# Patient Record
Sex: Female | Born: 1935 | Race: Black or African American | Hispanic: No | Marital: Married | Smoking: Former smoker
Health system: Southern US, Community
[De-identification: ages and names within clinical notes are randomized; demographics above are authoritative.]

## PROBLEM LIST (undated history)

## (undated) DIAGNOSIS — E039 Hypothyroidism, unspecified: Secondary | ICD-10-CM

## (undated) DIAGNOSIS — Z86010 Personal history of colonic polyps: Secondary | ICD-10-CM

## (undated) DIAGNOSIS — K449 Diaphragmatic hernia without obstruction or gangrene: Secondary | ICD-10-CM

## (undated) DIAGNOSIS — R51 Headache: Secondary | ICD-10-CM

## (undated) DIAGNOSIS — K219 Gastro-esophageal reflux disease without esophagitis: Secondary | ICD-10-CM

## (undated) DIAGNOSIS — Z800 Family history of malignant neoplasm of digestive organs: Secondary | ICD-10-CM

## (undated) DIAGNOSIS — C801 Malignant (primary) neoplasm, unspecified: Secondary | ICD-10-CM

## (undated) DIAGNOSIS — K5010 Crohn's disease of large intestine without complications: Secondary | ICD-10-CM

## (undated) DIAGNOSIS — J189 Pneumonia, unspecified organism: Secondary | ICD-10-CM

## (undated) DIAGNOSIS — K859 Acute pancreatitis without necrosis or infection, unspecified: Secondary | ICD-10-CM

## (undated) DIAGNOSIS — D125 Benign neoplasm of sigmoid colon: Secondary | ICD-10-CM

## (undated) DIAGNOSIS — K5090 Crohn's disease, unspecified, without complications: Secondary | ICD-10-CM

## (undated) DIAGNOSIS — K5730 Diverticulosis of large intestine without perforation or abscess without bleeding: Secondary | ICD-10-CM

## (undated) DIAGNOSIS — E785 Hyperlipidemia, unspecified: Secondary | ICD-10-CM

## (undated) DIAGNOSIS — K635 Polyp of colon: Secondary | ICD-10-CM

## (undated) DIAGNOSIS — K5909 Other constipation: Secondary | ICD-10-CM

## (undated) DIAGNOSIS — R32 Unspecified urinary incontinence: Secondary | ICD-10-CM

## (undated) DIAGNOSIS — I10 Essential (primary) hypertension: Secondary | ICD-10-CM

## (undated) DIAGNOSIS — K2270 Barrett's esophagus without dysplasia: Secondary | ICD-10-CM

## (undated) DIAGNOSIS — R519 Headache, unspecified: Secondary | ICD-10-CM

## (undated) DIAGNOSIS — M069 Rheumatoid arthritis, unspecified: Secondary | ICD-10-CM

## (undated) HISTORY — DX: Benign neoplasm of sigmoid colon: D12.5

## (undated) HISTORY — DX: Diaphragmatic hernia without obstruction or gangrene: K44.9

## (undated) HISTORY — DX: Family history of malignant neoplasm of digestive organs: Z80.0

## (undated) HISTORY — DX: Diverticulosis of large intestine without perforation or abscess without bleeding: K57.30

## (undated) HISTORY — DX: Barrett's esophagus without dysplasia: K22.70

## (undated) HISTORY — DX: Hypothyroidism, unspecified: E03.9

## (undated) HISTORY — DX: Crohn's disease, unspecified, without complications: K50.90

## (undated) HISTORY — DX: Personal history of colonic polyps: Z86.010

## (undated) HISTORY — DX: Hyperlipidemia, unspecified: E78.5

## (undated) HISTORY — DX: Polyp of colon: K63.5

## (undated) HISTORY — DX: Rheumatoid arthritis, unspecified: M06.9

## (undated) HISTORY — DX: Gastro-esophageal reflux disease without esophagitis: K21.9

## (undated) HISTORY — DX: Other constipation: K59.09

## (undated) HISTORY — DX: Unspecified urinary incontinence: R32

## (undated) HISTORY — DX: Crohn's disease of large intestine without complications: K50.10

## (undated) HISTORY — PX: KNEE ARTHROSCOPY: SUR90

## (undated) HISTORY — DX: Acute pancreatitis without necrosis or infection, unspecified: K85.90

---

## 1999-12-18 ENCOUNTER — Other Ambulatory Visit: Admission: RE | Admit: 1999-12-18 | Discharge: 1999-12-18 | Payer: Self-pay | Admitting: Gastroenterology

## 2001-07-08 ENCOUNTER — Ambulatory Visit (HOSPITAL_COMMUNITY): Admission: RE | Admit: 2001-07-08 | Discharge: 2001-07-08 | Payer: Self-pay | Admitting: Gastroenterology

## 2001-07-08 ENCOUNTER — Encounter: Payer: Self-pay | Admitting: Gastroenterology

## 2001-07-14 ENCOUNTER — Other Ambulatory Visit: Admission: RE | Admit: 2001-07-14 | Discharge: 2001-07-14 | Payer: Self-pay | Admitting: Obstetrics and Gynecology

## 2002-08-04 ENCOUNTER — Encounter: Admission: RE | Admit: 2002-08-04 | Discharge: 2002-08-04 | Payer: Self-pay | Admitting: Cardiology

## 2002-08-04 ENCOUNTER — Encounter: Payer: Self-pay | Admitting: Cardiology

## 2003-12-14 ENCOUNTER — Ambulatory Visit (HOSPITAL_COMMUNITY): Admission: RE | Admit: 2003-12-14 | Discharge: 2003-12-14 | Payer: Self-pay | Admitting: Unknown Physician Specialty

## 2005-03-21 ENCOUNTER — Ambulatory Visit: Payer: Self-pay | Admitting: Gastroenterology

## 2005-06-03 ENCOUNTER — Ambulatory Visit: Payer: Self-pay | Admitting: Gastroenterology

## 2006-04-08 ENCOUNTER — Ambulatory Visit: Payer: Self-pay | Admitting: Gastroenterology

## 2006-07-18 ENCOUNTER — Ambulatory Visit: Payer: Self-pay | Admitting: Gastroenterology

## 2007-02-04 ENCOUNTER — Ambulatory Visit: Payer: Self-pay | Admitting: Gastroenterology

## 2007-02-25 ENCOUNTER — Encounter: Payer: Self-pay | Admitting: Gastroenterology

## 2007-02-25 ENCOUNTER — Ambulatory Visit: Payer: Self-pay | Admitting: Gastroenterology

## 2007-07-07 ENCOUNTER — Ambulatory Visit: Payer: Self-pay | Admitting: Gastroenterology

## 2007-10-01 ENCOUNTER — Ambulatory Visit: Payer: Self-pay | Admitting: Internal Medicine

## 2007-10-01 LAB — CONVERTED CEMR LAB
Basophils Absolute: 0 10*3/uL (ref 0.0–0.1)
Basophils Relative: 0.2 % (ref 0.0–1.0)
Eosinophils Absolute: 0.3 10*3/uL (ref 0.0–0.6)
Eosinophils Relative: 5 % (ref 0.0–5.0)
HCT: 40.9 % (ref 36.0–46.0)
Hemoglobin: 13.2 g/dL (ref 12.0–15.0)
Lymphocytes Relative: 24.4 % (ref 12.0–46.0)
MCHC: 32.3 g/dL (ref 30.0–36.0)
MCV: 90.1 fL (ref 78.0–100.0)
Monocytes Absolute: 0.3 10*3/uL (ref 0.2–0.7)
Monocytes Relative: 4.5 % (ref 3.0–11.0)
Neutro Abs: 4.5 10*3/uL (ref 1.4–7.7)
Neutrophils Relative %: 65.9 % (ref 43.0–77.0)
Platelets: 289 10*3/uL (ref 150–400)
RBC: 4.53 M/uL (ref 3.87–5.11)
RDW: 13.3 % (ref 11.5–14.6)
WBC: 6.8 10*3/uL (ref 4.5–10.5)

## 2007-10-02 ENCOUNTER — Ambulatory Visit: Payer: Self-pay | Admitting: Gastroenterology

## 2007-10-02 LAB — CONVERTED CEMR LAB
Basophils Absolute: 0 10*3/uL (ref 0.0–0.1)
Basophils Relative: 0.5 % (ref 0.0–1.0)
CRP, High Sensitivity: 2 (ref 0.00–5.00)
Eosinophils Absolute: 0.3 10*3/uL (ref 0.0–0.6)
Eosinophils Relative: 5.6 % — ABNORMAL HIGH (ref 0.0–5.0)
Ferritin: 204.4 ng/mL (ref 10.0–291.0)
Folate: 8 ng/mL
HCT: 42.3 % (ref 36.0–46.0)
Hemoglobin: 13.3 g/dL (ref 12.0–15.0)
Iron: 109 ug/dL (ref 42–145)
Lymphocytes Relative: 23.4 % (ref 12.0–46.0)
MCHC: 31.5 g/dL (ref 30.0–36.0)
MCV: 90.3 fL (ref 78.0–100.0)
Monocytes Absolute: 0.3 10*3/uL (ref 0.2–0.7)
Monocytes Relative: 6.3 % (ref 3.0–11.0)
Neutro Abs: 2.9 10*3/uL (ref 1.4–7.7)
Neutrophils Relative %: 64.2 % (ref 43.0–77.0)
Platelets: 294 10*3/uL (ref 150–400)
RBC: 4.68 M/uL (ref 3.87–5.11)
RDW: 13.4 % (ref 11.5–14.6)
Saturation Ratios: 31.5 % (ref 20.0–50.0)
Sed Rate: 30 mm/hr — ABNORMAL HIGH (ref 0–25)
Transferrin: 246.8 mg/dL (ref >=212.0)
Vitamin B-12: 383 pg/mL (ref 211–911)
WBC: 4.6 10*3/uL (ref 4.5–10.5)

## 2007-10-07 ENCOUNTER — Encounter: Payer: Self-pay | Admitting: Internal Medicine

## 2007-10-07 ENCOUNTER — Ambulatory Visit: Payer: Self-pay | Admitting: Internal Medicine

## 2007-10-22 ENCOUNTER — Ambulatory Visit: Payer: Self-pay | Admitting: Gastroenterology

## 2008-04-22 ENCOUNTER — Telehealth: Payer: Self-pay | Admitting: Gastroenterology

## 2008-04-29 DIAGNOSIS — E785 Hyperlipidemia, unspecified: Secondary | ICD-10-CM

## 2008-04-29 DIAGNOSIS — K219 Gastro-esophageal reflux disease without esophagitis: Secondary | ICD-10-CM | POA: Insufficient documentation

## 2008-04-29 DIAGNOSIS — K2270 Barrett's esophagus without dysplasia: Secondary | ICD-10-CM | POA: Insufficient documentation

## 2008-04-29 DIAGNOSIS — K5730 Diverticulosis of large intestine without perforation or abscess without bleeding: Secondary | ICD-10-CM | POA: Insufficient documentation

## 2008-04-29 DIAGNOSIS — D126 Benign neoplasm of colon, unspecified: Secondary | ICD-10-CM | POA: Insufficient documentation

## 2008-04-29 DIAGNOSIS — E039 Hypothyroidism, unspecified: Secondary | ICD-10-CM | POA: Insufficient documentation

## 2008-04-29 DIAGNOSIS — K449 Diaphragmatic hernia without obstruction or gangrene: Secondary | ICD-10-CM | POA: Insufficient documentation

## 2008-04-29 DIAGNOSIS — M069 Rheumatoid arthritis, unspecified: Secondary | ICD-10-CM | POA: Insufficient documentation

## 2008-04-29 DIAGNOSIS — K5909 Other constipation: Secondary | ICD-10-CM

## 2008-05-03 ENCOUNTER — Ambulatory Visit: Payer: Self-pay | Admitting: Gastroenterology

## 2008-05-03 LAB — CONVERTED CEMR LAB
Basophils Absolute: 0 10*3/uL (ref 0.0–0.1)
Basophils Relative: 0.1 % (ref 0.0–3.0)
Eosinophils Absolute: 0.4 10*3/uL (ref 0.0–0.7)
Eosinophils Relative: 5.6 % — ABNORMAL HIGH (ref 0.0–5.0)
HCT: 40 % (ref 36.0–46.0)
Hemoglobin: 13.5 g/dL (ref 12.0–15.0)
Lymphocytes Relative: 20 % (ref 12.0–46.0)
MCHC: 33.8 g/dL (ref 30.0–36.0)
MCV: 91.5 fL (ref 78.0–100.0)
Monocytes Absolute: 0.3 10*3/uL (ref 0.1–1.0)
Monocytes Relative: 4.4 % (ref 3.0–12.0)
Neutro Abs: 4.7 10*3/uL (ref 1.4–7.7)
Neutrophils Relative %: 69.9 % (ref 43.0–77.0)
Platelets: 290 10*3/uL (ref 150–400)
RBC: 4.37 M/uL (ref 3.87–5.11)
RDW: 12.8 % (ref 11.5–14.6)
Sed Rate: 30 mm/hr — ABNORMAL HIGH (ref 0–22)
WBC: 6.7 10*3/uL (ref 4.5–10.5)

## 2009-02-20 ENCOUNTER — Telehealth: Payer: Self-pay | Admitting: Gastroenterology

## 2009-02-27 ENCOUNTER — Ambulatory Visit: Payer: Self-pay | Admitting: Gastroenterology

## 2009-02-27 DIAGNOSIS — K5289 Other specified noninfective gastroenteritis and colitis: Secondary | ICD-10-CM | POA: Insufficient documentation

## 2009-02-27 DIAGNOSIS — R32 Unspecified urinary incontinence: Secondary | ICD-10-CM

## 2009-02-27 DIAGNOSIS — K625 Hemorrhage of anus and rectum: Secondary | ICD-10-CM | POA: Insufficient documentation

## 2009-03-01 LAB — CONVERTED CEMR LAB
ALT: 20 units/L (ref 0–35)
AST: 19 units/L (ref 0–37)
Albumin: 4.2 g/dL (ref 3.5–5.2)
Alkaline Phosphatase: 68 units/L (ref 39–117)
BUN: 18 mg/dL (ref 6–23)
Basophils Absolute: 0 10*3/uL (ref 0.0–0.1)
Basophils Relative: 0.4 % (ref 0.0–3.0)
Bilirubin Urine: NEGATIVE
Bilirubin, Direct: 0.1 mg/dL (ref 0.0–0.3)
CO2: 31 meq/L (ref 19–32)
Calcium: 9.1 mg/dL (ref 8.4–10.5)
Chloride: 105 meq/L (ref 96–112)
Creatinine, Ser: 1.2 mg/dL (ref 0.4–1.2)
Eosinophils Absolute: 0.4 10*3/uL (ref 0.0–0.7)
Eosinophils Relative: 6.4 % — ABNORMAL HIGH (ref 0.0–5.0)
Ferritin: 157 ng/mL (ref 10.0–291.0)
Folate: 8.3 ng/mL
GFR calc non Af Amer: 56.3 mL/min (ref >60)
Glucose, Bld: 91 mg/dL (ref 70–99)
HCT: 39.7 % (ref 36.0–46.0)
Hemoglobin: 13.4 g/dL (ref 12.0–15.0)
Iron: 69 ug/dL (ref 42–145)
Ketones, ur: NEGATIVE mg/dL
Leukocytes, UA: NEGATIVE
Lymphocytes Relative: 22.8 % (ref 12.0–46.0)
Lymphs Abs: 1.5 10*3/uL (ref 0.7–4.0)
MCHC: 33.8 g/dL (ref 30.0–36.0)
MCV: 90.4 fL (ref 78.0–100.0)
Monocytes Absolute: 0.4 10*3/uL (ref 0.1–1.0)
Monocytes Relative: 5.6 % (ref 3.0–12.0)
Neutro Abs: 4.1 10*3/uL (ref 1.4–7.7)
Neutrophils Relative %: 64.8 % (ref 43.0–77.0)
Nitrite: NEGATIVE
Platelets: 248 10*3/uL (ref 150.0–400.0)
Potassium: 4.4 meq/L (ref 3.5–5.1)
RBC: 4.4 M/uL (ref 3.87–5.11)
RDW: 12.7 % (ref 11.5–14.6)
Saturation Ratios: 20.8 % (ref 20.0–50.0)
Sodium: 142 meq/L (ref 135–145)
Specific Gravity, Urine: 1.025 (ref 1.000–1.030)
TSH: 3.77 microintl units/mL (ref 0.35–5.50)
Total Bilirubin: 0.7 mg/dL (ref 0.3–1.2)
Total Protein, Urine: NEGATIVE mg/dL
Total Protein: 8.1 g/dL (ref 6.0–8.3)
Transferrin: 236.7 mg/dL (ref 212.0–360.0)
Urine Glucose: NEGATIVE mg/dL
Urobilinogen, UA: 0.2 (ref 0.0–1.0)
Vitamin B-12: 463 pg/mL (ref 211–911)
WBC: 6.4 10*3/uL (ref 4.5–10.5)
pH: 5.5 (ref 5.0–8.0)

## 2009-03-16 ENCOUNTER — Encounter: Payer: Self-pay | Admitting: Gastroenterology

## 2009-03-22 ENCOUNTER — Encounter: Payer: Self-pay | Admitting: Gastroenterology

## 2009-03-22 ENCOUNTER — Ambulatory Visit: Payer: Self-pay | Admitting: Gastroenterology

## 2009-03-27 ENCOUNTER — Encounter: Payer: Self-pay | Admitting: Gastroenterology

## 2009-11-09 ENCOUNTER — Telehealth: Payer: Self-pay | Admitting: Gastroenterology

## 2009-11-13 ENCOUNTER — Telehealth: Payer: Self-pay | Admitting: Gastroenterology

## 2010-01-19 ENCOUNTER — Encounter: Payer: Self-pay | Admitting: Gastroenterology

## 2010-01-25 ENCOUNTER — Ambulatory Visit: Payer: Self-pay | Admitting: Gastroenterology

## 2010-01-25 DIAGNOSIS — K5010 Crohn's disease of large intestine without complications: Secondary | ICD-10-CM | POA: Insufficient documentation

## 2010-01-25 LAB — CONVERTED CEMR LAB
ALT: 20 units/L (ref 0–35)
AST: 20 units/L (ref 0–37)
Albumin: 4.5 g/dL (ref 3.5–5.2)
Alkaline Phosphatase: 69 units/L (ref 39–117)
BUN: 16 mg/dL (ref 6–23)
Basophils Absolute: 0 10*3/uL (ref 0.0–0.1)
Basophils Relative: 0.4 % (ref 0.0–3.0)
Bilirubin, Direct: 0.1 mg/dL (ref 0.0–0.3)
CO2: 32 meq/L (ref 19–32)
Calcium: 9.5 mg/dL (ref 8.4–10.5)
Chloride: 101 meq/L (ref 96–112)
Creatinine, Ser: 1.1 mg/dL (ref 0.4–1.2)
Eosinophils Absolute: 0.4 10*3/uL (ref 0.0–0.7)
Eosinophils Relative: 5.6 % — ABNORMAL HIGH (ref 0.0–5.0)
Ferritin: 180.3 ng/mL (ref 10.0–291.0)
Folate: 9.8 ng/mL
GFR calc non Af Amer: 62.09 mL/min (ref >60)
Glucose, Bld: 94 mg/dL (ref 70–99)
HCT: 41 % (ref 36.0–46.0)
Hemoglobin: 13.9 g/dL (ref 12.0–15.0)
Iron: 87 ug/dL (ref 42–145)
Lymphocytes Relative: 24.5 % (ref 12.0–46.0)
Lymphs Abs: 1.6 10*3/uL (ref 0.7–4.0)
MCHC: 33.9 g/dL (ref 30.0–36.0)
MCV: 91.1 fL (ref 78.0–100.0)
Magnesium: 2.8 mg/dL — ABNORMAL HIGH (ref 1.5–2.5)
Monocytes Absolute: 0.4 10*3/uL (ref 0.1–1.0)
Monocytes Relative: 5.4 % (ref 3.0–12.0)
Neutro Abs: 4.1 10*3/uL (ref 1.4–7.7)
Neutrophils Relative %: 64.1 % (ref 43.0–77.0)
Platelets: 284 10*3/uL (ref 150.0–400.0)
Potassium: 4.9 meq/L (ref 3.5–5.1)
RBC: 4.51 M/uL (ref 3.87–5.11)
RDW: 13.5 % (ref 11.5–14.6)
Saturation Ratios: 26 % (ref 20.0–50.0)
Sed Rate: 24 mm/hr — ABNORMAL HIGH (ref 0–22)
Sodium: 136 meq/L (ref 135–145)
TSH: 3.5 microintl units/mL (ref 0.35–5.50)
Total Bilirubin: 0.5 mg/dL (ref 0.3–1.2)
Total Protein: 8.3 g/dL (ref 6.0–8.3)
Transferrin: 239.2 mg/dL (ref 212.0–360.0)
Vitamin B-12: 464 pg/mL (ref 211–911)
WBC: 6.5 10*3/uL (ref 4.5–10.5)

## 2010-02-06 ENCOUNTER — Telehealth: Payer: Self-pay | Admitting: Gastroenterology

## 2010-03-16 ENCOUNTER — Telehealth: Payer: Self-pay | Admitting: Gastroenterology

## 2010-03-19 ENCOUNTER — Telehealth: Payer: Self-pay | Admitting: Gastroenterology

## 2010-08-30 ENCOUNTER — Ambulatory Visit (INDEPENDENT_AMBULATORY_CARE_PROVIDER_SITE_OTHER): Payer: Medicare Other | Admitting: Gastroenterology

## 2010-08-30 ENCOUNTER — Other Ambulatory Visit: Payer: Self-pay | Admitting: Gastroenterology

## 2010-08-30 ENCOUNTER — Encounter (INDEPENDENT_AMBULATORY_CARE_PROVIDER_SITE_OTHER): Payer: Self-pay | Admitting: *Deleted

## 2010-08-30 ENCOUNTER — Encounter: Payer: Self-pay | Admitting: Gastroenterology

## 2010-08-30 ENCOUNTER — Other Ambulatory Visit: Payer: Medicare Other

## 2010-08-30 DIAGNOSIS — K219 Gastro-esophageal reflux disease without esophagitis: Secondary | ICD-10-CM

## 2010-08-30 DIAGNOSIS — K625 Hemorrhage of anus and rectum: Secondary | ICD-10-CM

## 2010-08-30 LAB — CBC WITH DIFFERENTIAL/PLATELET
Basophils Absolute: 0 10*3/uL (ref 0.0–0.1)
Basophils Relative: 0.5 % (ref 0.0–3.0)
Eosinophils Absolute: 0.4 10*3/uL (ref 0.0–0.7)
Eosinophils Relative: 6.3 % — ABNORMAL HIGH (ref 0.0–5.0)
HCT: 38.2 % (ref 36.0–46.0)
Hemoglobin: 12.9 g/dL (ref 12.0–15.0)
Lymphocytes Relative: 24.4 % (ref 12.0–46.0)
Lymphs Abs: 1.4 10*3/uL (ref 0.7–4.0)
MCHC: 33.8 g/dL (ref 30.0–36.0)
MCV: 90.9 fl (ref 78.0–100.0)
Monocytes Absolute: 0.5 10*3/uL (ref 0.1–1.0)
Monocytes Relative: 8.4 % (ref 3.0–12.0)
Neutro Abs: 3.5 10*3/uL (ref 1.4–7.7)
Neutrophils Relative %: 60.4 % (ref 43.0–77.0)
Platelets: 286 10*3/uL (ref 150.0–400.0)
RBC: 4.21 Mil/uL (ref 3.87–5.11)
RDW: 13.3 % (ref 11.5–14.6)
WBC: 5.8 10*3/uL (ref 4.5–10.5)

## 2010-08-30 LAB — BASIC METABOLIC PANEL
BUN: 15 mg/dL (ref 6–23)
CO2: 28 mEq/L (ref 19–32)
Calcium: 9.1 mg/dL (ref 8.4–10.5)
Creatinine, Ser: 1 mg/dL (ref 0.4–1.2)
GFR: 67.64 mL/min (ref >60.00)
Glucose, Bld: 87 mg/dL (ref 70–99)
Sodium: 134 mEq/L — ABNORMAL LOW (ref 135–145)

## 2010-08-30 LAB — VITAMIN B12: Vitamin B-12: 494 pg/mL (ref 211–911)

## 2010-08-30 LAB — FOLATE: Folate: 11.9 ng/mL (ref >5.9)

## 2010-08-30 LAB — IBC PANEL: Saturation Ratios: 23 % (ref 20.0–50.0)

## 2010-08-30 LAB — TSH: TSH: 1.18 u[IU]/mL (ref 0.35–5.50)

## 2010-08-30 LAB — HEPATIC FUNCTION PANEL
Albumin: 4 g/dL (ref 3.5–5.2)
Alkaline Phosphatase: 60 U/L (ref 39–117)
Total Protein: 7.4 g/dL (ref 6.0–8.3)

## 2010-08-30 LAB — FERRITIN: Ferritin: 210.1 ng/mL (ref 10.0–291.0)

## 2010-08-30 NOTE — Progress Notes (Signed)
Summary: med ?  Phone Note Call from Patient Call back at Home Phone [phone]   Caller: Patient Call For: Dr. Jarold Motto Reason for Call: Talk to Nurse Summary of Call: med, Denese Killings, is unaffordable for pt Initial call taken by: Vallarie Mare,  November 13, 2009 4:13 PM  Follow-up for Phone Call        See phone note from 11/09/09. Follow-up by: Ashok Cordia RN,  November 13, 2009 4:40 PM  Additional Follow-up for Phone Call Additional follow up Details #1::        annusol hc is ok Additional Follow-up by: Mardella Layman MD [facility],  November 14, 2009 11:29 AM     Appended Document: med ? Talked with pt.  States she if doing better so she will wait a few days before deciding about the suppositories.  She will report back.

## 2010-08-30 NOTE — Progress Notes (Signed)
Summary: Triage  Phone Note Call from Patient   Caller: Patient Summary of Call: Pt calling.  She con't to have some bright red rectal bleeding.  She did not try the canasa suppos that were suggested at last OV due to the cost.  Pt asks if she can try the anusol HC suppos again.  This is alot less expensive.  Pt also asks if she should cont's the lialda.  She feels thei does not help as much as the asacol did.   Initial call taken by: Ashok Cordia RN,  March 16, 2010 2:07 PM  Follow-up for Phone Call        ok for changes Follow-up by: Mardella Layman MD FACG,  March 16, 2010 2:33 PM  Additional Follow-up for Phone Call Additional follow up Details #1::        Pt notified.  Rx sent for supposiories. Pt req samples of asacol.  States this is expensive also. Wil check on samples. Additional Follow-up by: Ashok Cordia RN,  March 16, 2010 2:44 PM    New/Updated Medications: ANUSOL-HC 25 MG  SUPP (HYDROCORTISONE ACETATE) 1 per rectum q hs Prescriptions: ANUSOL-HC 25 MG  SUPP (HYDROCORTISONE ACETATE) 1 per rectum q hs  #30 x 3   Entered by:   Ashok Cordia RN   Authorized by:   Mardella Layman MD [facility]   Signed by:   Ashok Cordia RN on 03/16/2010   Method used:   [facility]  [address] [phone]* (retail)       [address]       [place], [place]  [address]       Ph: [phone] or [phone]       Fax: [phone]   RxID:   [id number]

## 2010-08-30 NOTE — Progress Notes (Signed)
Summary: Lab results  Phone Note Call from Patient Call back at [phone] Cell#   Caller: Patient Call For: Dr. Jarold Motto Reason for Call: Lab or Test Results Summary of Call: Would like Lab results Initial call taken by: Karna Christmas,  February 06, 2010 10:52 AM  Follow-up for Phone Call        Discussed lab results with pt.  Pt asks that we send copy to Dr. Althea Grimmer.  This was done as requested. Follow-up by: Ashok Cordia RN,  February 06, 2010 11:09 AM

## 2010-08-30 NOTE — Assessment & Plan Note (Signed)
Summary: annual f/u...as.   History of Present Illness Visit Type: Follow-up Visit Primary GI MD: Sheryn Bison MD FACP FAGA Primary Provider: Truddie Crumble, MD Requesting Provider: n/a Chief Complaint: F/u for Crohn's. Pt states that she is having alot of gas  History of Present Illness:   She is a 77 year old African American female who has chronic segmental colitis involving her sigmoid colon with associated severe diverticulosis. Colonoscopy was last performed in August of 2010. She supposedly is on oral Asacol 800 mg 3 times a day which she takes very irregularly. She does use Cana1 g suppositories p.r.n. Currently she denies abdominal pain or rectal bleeding her appetite is good her weight is stable. She has excessive gas related to ingestion of sorbitol. Associated with her IBD is low back pain consistent with sacroiliitis , and recurrent oral stomatitis.   GI Review of Systems      Denies abdominal pain, acid reflux, belching, bloating, chest pain, dysphagia with liquids, dysphagia with solids, heartburn, loss of appetite, nausea, vomiting, vomiting blood, weight loss, and  weight gain.        Denies anal fissure, black tarry stools, change in bowel habit, constipation, diarrhea, diverticulosis, fecal incontinence, heme positive stool, hemorrhoids, irritable bowel syndrome, jaundice, light color stool, liver problems, rectal bleeding, and  rectal pain.    Current Medications (verified): 1)  Asacol Hd 800 Mg Tbec (Mesalamine) .... Take One By Mouth Three Times A Day 2)  Protonix 40 Mg Tbec (Pantoprazole Sodium) .... Take 1 Tablet By Mouth Once A Day 3)  Synthroid 125 Mcg Tabs (Levothyroxine Sodium) .... Take One By Mouth Once Daily 4)  Diovan 160 Mg Tabs (Valsartan) .... Take One By Mouth Once Daily 5)  Vitamin D 1000 Unit Tabs (Cholecalciferol) .... One Tablet By Mouth Two Times A Day 6)  Levsin 0.125 Mg Tabs (Hyoscyamine Sulfate) .... Take One By Mouth As Needed 7)  Canasa  1000 Mg  Supp (Mesalamine) .Marland Kitchen.. 1 Q Hs  Allergies (verified): No Known Drug Allergies  Past History:  Past medical, surgical, family and social histories (including risk factors) reviewed for relevance to current acute and chronic problems.  Past Medical History: Reviewed history from 04/29/2008 and no changes required. Current Problems:  HYPOTHYROIDISM (ICD-244.9) ARTHRITIS, RHEUMATOID (ICD-714.0) HYPERLIPIDEMIA (ICD-272.4) CONSTIPATION, CHRONIC (ICD-564.09) BARRETTS ESOPHAGUS (ICD-530.85) GERD (ICD-530.81) HIATAL HERNIA (ICD-553.3) CROHN'S DISEASE (ICD-555.9) COLONIC POLYPS, ADENOMATOUS, HX OF (ICD-V12.72) SIGMOID POLYP (ICD-211.3) DIVERTICULOSIS, COLON (ICD-562.10)  Past Surgical History: Reviewed history from 04/29/2008 and no changes required. right knee arthroscopy  Family History: Reviewed history from 05/03/2008 and no changes required. Family History of Colon Cancer:mother Family History of Esophageal Cancer:  Brother Family History of Heart Disease: Father, Mother  Social History: Reviewed history from 05/03/2008 and no changes required. Patient currently smokes. 6 cigarettes daily married Alcohol Use - no Daily Caffeine Use Illicit Drug Use - no Patient gets regular exercise.  Vital Signs:  Patient profile:   77 year old female Height:      65 inches Weight:      177 pounds BMI:     29.56 BSA:     1.88 Pulse rate:   74 / minute Pulse rhythm:   regular BP sitting:   134 / 82  (left arm) Cuff size:   regular  Vitals Entered By: Ok Anis CMA (January 25, 2010 10:42 AM)  Review of Systems       The patient complains of arthritis/joint pain and back pain.  The patient denies allergy/sinus, anemia, anxiety-new,  blood in urine, breast changes/lumps, change in vision, confusion, cough, coughing up blood, depression-new, fainting, fatigue, fever, headaches-new, hearing problems, heart murmur, heart rhythm changes, itching, menstrual pain, muscle  pains/cramps, night sweats, nosebleeds, pregnancy symptoms, shortness of breath, skin rash, sleeping problems, sore throat, swelling of feet/legs, swollen lymph glands, thirst - excessive , urination - excessive , urination changes/pain, urine leakage, vision changes, and voice change.    CC: F/u for Crohn's. Pt states that she is having alot of gas    Physical Exam  General:  Well developed, well nourished, no acute distress.healthy appearing.   Head:  Normocephalic and atraumatic. Eyes:  PERRLA, no icterus.exam deferred to patient's ophthalmologist.   Lungs:  Clear throughout to auscultation. Heart:  Regular rate and rhythm; no murmurs, rubs,  or bruits. Abdomen:  Soft, nontender and nondistended. No masses, hepatosplenomegaly or hernias noted. Normal bowel sounds. Psych:  Alert and cooperative. Normal mood and affect.   Impression & Recommendations:  Problem # 1:  CROHN'S DISEASE-LARGE INTESTINE (ICD-555.1) Assessment Improved She has chronic segmental colitis with periodic flares. She is unreliable in terms of taking her medications. I have changed her to Lialda 2.4 g a day with p.r.n. Canasa 1 g suppository use. Repeat labs have been ordered today. She has been asked to avoid sorbitol and fructose in her diet.  Problem # 2:  RECTAL BLEEDING (ICD-569.3) Assessment: Improved  Problem # 3:  ARTHRITIS, RHEUMATOID (ICD-714.0) Assessment: Unchanged This diagnosis is unclear---I suspect her arthritis is associated with her inflammatory bowel disease as is her oral stomatitis. I have recommended avoidance of anti-inflammatories p.r.n. acetaminophen use and p.r.n. Orabase to oral lesions. Sedimentation rate was ordered today.  Problem # 4:  BARRETTS ESOPHAGUS (ICD-530.85) Assessment: Unchanged Her Reflux is well controlled on daily Protonix 40 mg which she is to continue.  Other Orders: TLB-CBC Platelet - w/Differential (85025-CBCD) TLB-BMP (Basic Metabolic Panel-BMET)  (80048-METABOL) TLB-Hepatic/Liver Function Pnl (80076-HEPATIC) TLB-TSH (Thyroid Stimulating Hormone) (84443-TSH) TLB-B12, Serum-Total ONLY (29562-Z30) TLB-Ferritin (82728-FER) TLB-Folic Acid (Folate) (82746-FOL) TLB-IBC Pnl (Iron/FE;Transferrin) (83550-IBC) TLB-Magnesium (Mg) (83735-MG) TLB-Sedimentation Rate (ESR) (85652-ESR)  Patient Instructions: 1)  Please avoid sorbitol and fructose in your diet. 2)  Please go to the basement to have your labs drawn before leaving the office today. We have given you orders to take with you. 3)  Please use [facility] which can be purchased over the counter. 4)  You can use Oravase for your mouth sores. This can be purchased over the counter. 5)  We have given you a gas handout to review. 6)  Please take Lialda 1 tablet by mouth two times a day in place of Asacol. An electronic prescription has been sent to your pharmacy. 7)  Copy sent to : Truddie Crumble, MD 8)  The medication list was reviewed and reconciled.  All changed / newly prescribed medications were explained.  A complete medication list was provided to the patient / caregiver. Prescriptions: LIALDA 1.2 GM TBEC (MESALAMINE) Take 1 tablet by mouth two times a day  #60 x 3   Entered by:   Lamona Curl CMA (AAMA)   Authorized by:   Mardella Layman MD [facility]   Signed by:   Lamona Curl CMA (AAMA) on 01/25/2010   Method used:   [facility]  [address] [phone][facility]       [address]       [place]  [place], [place]  [address]       Ph: [phone] or [phone]       Fax: [phone]   RxID:   [id number]

## 2010-08-30 NOTE — Progress Notes (Signed)
Summary: Triage  Phone Note Outgoing Call   Call placed by: Ashok Cordia RN,  March 19, 2010 11:23 AM Summary of Call: See previous phone note.  Pt wants to go back on asacol.  Pt req samples.  Asacol HD is only available sample.  Pt had been on asacol 400 mg three tabs two times a day.  How should she take the Asacol HD? Initial call taken by: Ashok Cordia RN,  March 19, 2010 11:25 AM  Follow-up for Phone Call        she can afford meds... Follow-up by: Mardella Layman MD [facility],  March 19, 2010 11:28 AM  Additional Follow-up for Phone Call Additional follow up Details #1::        Can take asacol HD 2 tabs two times a day.   Per Dr. Jarold Motto.  Samples given.   Additional Follow-up by: Ashok Cordia RN,  March 19, 2010 12:25 PM    New/Updated Medications: ASACOL HD 800 MG TBEC (MESALAMINE) 2 tabs two times a day/

## 2010-08-30 NOTE — Progress Notes (Signed)
Summary: Blood in stools  Phone Note Call from Patient Call back at Home Phone [phone]   Call For: DR Damean Poffenberger Reason for Call: Talk to Nurse Summary of Call: Blood in her stools. Initial call taken by: Leanor Kail [facility],  November 09, 2009 10:42 AM  Follow-up for Phone Call        Pt has hx of segmental colitis.  On asacol one tab two times a day.  Began 2 weeks ago with small amt of bright red rectal bleeding.  More frequent stools.  States in past has used suppositories which have been helpful.  Req RX.   (pt may be reached at [phone]) Follow-up by: Ashok Cordia RN,  November 09, 2009 1:07 PM  Additional Follow-up for Phone Call Additional follow up Details #1::        needs 2 asacol two times a day and supp... Additional Follow-up by: Mardella Layman MD FACG,  November 10, 2009 8:19 AM    Additional Follow-up for Phone Call Additional follow up Details #2::    2 asacol two times a day and canasa supp at bedtime  Follow-up by: Mardella Layman MD [facility],  November 10, 2009 8:19 AM  Additional Follow-up for Phone Call Additional follow up Details #3:: Details for Additional Follow-up Action Taken: Pt notified.  Pt will report back. Additional Follow-up by: Ashok Cordia RN,  November 10, 2009 8:31 AM  New/Updated Medications: CANASA 1000 MG  SUPP (MESALAMINE) 1 q hs Prescriptions: CANASA 1000 MG  SUPP (MESALAMINE) 1 q hs  #30 x 1   Entered by:   Ashok Cordia RN   Authorized by:   Mardella Layman MD [facility]   Signed by:   Ashok Cordia RN on 11/10/2009   Method used:   [facility]  [address] [phone]* (retail)       [address]       [place], [place]  [address]       Ph: [phone] or [phone]       Fax: [phone]   RxID:   [id number]

## 2010-08-31 ENCOUNTER — Telehealth: Payer: Self-pay | Admitting: Gastroenterology

## 2010-09-02 ENCOUNTER — Emergency Department (HOSPITAL_COMMUNITY)
Admission: EM | Admit: 2010-09-02 | Discharge: 2010-09-02 | Disposition: A | Discharge status: Home / Self Care | Payer: Medicare Other | Attending: Emergency Medicine | Admitting: Emergency Medicine

## 2010-09-02 DIAGNOSIS — I10 Essential (primary) hypertension: Secondary | ICD-10-CM | POA: Insufficient documentation

## 2010-09-02 DIAGNOSIS — Z79899 Other long term (current) drug therapy: Secondary | ICD-10-CM | POA: Insufficient documentation

## 2010-09-02 DIAGNOSIS — K219 Gastro-esophageal reflux disease without esophagitis: Secondary | ICD-10-CM | POA: Insufficient documentation

## 2010-09-02 DIAGNOSIS — E039 Hypothyroidism, unspecified: Secondary | ICD-10-CM | POA: Insufficient documentation

## 2010-09-02 DIAGNOSIS — E789 Disorder of lipoprotein metabolism, unspecified: Secondary | ICD-10-CM | POA: Insufficient documentation

## 2010-09-03 ENCOUNTER — Encounter: Payer: Self-pay | Admitting: Gastroenterology

## 2010-09-03 ENCOUNTER — Other Ambulatory Visit: Payer: Self-pay | Admitting: Gastroenterology

## 2010-09-03 ENCOUNTER — Encounter (AMBULATORY_SURGERY_CENTER): Payer: Medicare Other | Admitting: Gastroenterology

## 2010-09-03 DIAGNOSIS — K2960 Other gastritis without bleeding: Secondary | ICD-10-CM

## 2010-09-03 DIAGNOSIS — K219 Gastro-esophageal reflux disease without esophagitis: Secondary | ICD-10-CM

## 2010-09-03 DIAGNOSIS — Z86010 Personal history of colonic polyps: Secondary | ICD-10-CM

## 2010-09-03 DIAGNOSIS — D126 Benign neoplasm of colon, unspecified: Secondary | ICD-10-CM

## 2010-09-03 DIAGNOSIS — K5289 Other specified noninfective gastroenteritis and colitis: Secondary | ICD-10-CM

## 2010-09-03 DIAGNOSIS — K625 Hemorrhage of anus and rectum: Secondary | ICD-10-CM

## 2010-09-04 ENCOUNTER — Telehealth: Payer: Self-pay | Admitting: Internal Medicine

## 2010-09-04 ENCOUNTER — Telehealth: Payer: Self-pay | Admitting: Gastroenterology

## 2010-09-05 ENCOUNTER — Encounter: Payer: Self-pay | Admitting: Gastroenterology

## 2010-09-05 DIAGNOSIS — Z86010 Personal history of colonic polyps: Secondary | ICD-10-CM

## 2010-09-05 HISTORY — DX: Personal history of colonic polyps: Z86.010

## 2010-09-05 NOTE — Assessment & Plan Note (Signed)
Summary: 54m fu/sched w-pt/cx fee/yf   Vital Signs:  Patient profile:   77 year old female Height:      65 inches Weight:      178.25 pounds BMI:     29.77 Pulse rate:   80 / minute Pulse rhythm:   regular BP sitting:   130 / 86  (left arm) Cuff size:   regular  Vitals Entered By: June McMurray CMA [facility]) (August 30, 2010 10:59 AM)  History of Present Illness Visit Type: Follow-up Visit Primary GI MD: Sheryn Bison MD FACP FAGA Primary Provider: Truddie Crumble, MD Requesting Provider: n/a Chief Complaint: Dark blood in stool,fowl odor, abdominal pain  History of Present Illness:   Extremely pleasant 77 year old after American female who has chronic segmental colitis was severe diverticulosis doing fairly well on Asacol 1.6 g twice a day. However, for the last several weeks she's had increased abdominal discomfort, gas, bloating, and rectal bleeding. She also has had excessive flatus and worsening acid reflux symptoms without dysphagia. She previously was on Protonix, but discontinued this. She denies recent antibiotic use, fever, chills, or any hepatobiliary problems. History is compatible with lactose intolerance. Her mother died of colon cancer. The patient denies abuse of NSAIDs, alcohol, or cigarettes.   GI Review of Systems    Reports abdominal pain, acid reflux, and  bloating.     Location of  Abdominal pain: upper abdomen.    Denies belching, chest pain, dysphagia with liquids, dysphagia with solids, heartburn, loss of appetite, nausea, vomiting, vomiting blood, weight loss, and  weight gain.      Reports constipation, diverticulosis, and  rectal bleeding.     Denies anal fissure, black tarry stools, change in bowel habit, diarrhea, fecal incontinence, heme positive stool, hemorrhoids, irritable bowel syndrome, jaundice, light color stool, liver problems, and  rectal pain.  Current Medications (verified): 1)  Synthroid 125 Mcg Tabs (Levothyroxine Sodium) .... Take One  By Mouth Once Daily 2)  Diovan 160 Mg Tabs (Valsartan) .... Take One By Mouth Once Daily 3)  Vitamin D 1000 Unit Tabs (Cholecalciferol) .... One Tablet By Mouth Two Times A Day 4)  Asacol Hd 800 Mg Tbec (Mesalamine) .... 2 Tabs Two Times A Day/  Allergies (verified): No Known Drug Allergies  Past History:  Past medical, surgical, family and social histories (including risk factors) reviewed for relevance to current acute and chronic problems.  Past Medical History: Reviewed history from 04/29/2008 and no changes required. Current Problems:  HYPOTHYROIDISM (ICD-244.9) ARTHRITIS, RHEUMATOID (ICD-714.0) HYPERLIPIDEMIA (ICD-272.4) CONSTIPATION, CHRONIC (ICD-564.09) BARRETTS ESOPHAGUS (ICD-530.85) GERD (ICD-530.81) HIATAL HERNIA (ICD-553.3) CROHN'S DISEASE (ICD-555.9) COLONIC POLYPS, ADENOMATOUS, HX OF (ICD-V12.72) SIGMOID POLYP (ICD-211.3) DIVERTICULOSIS, COLON (ICD-562.10)  Past Surgical History: Reviewed history from 04/29/2008 and no changes required. right knee arthroscopy  Family History: Reviewed history from 05/03/2008 and no changes required. Family History of Colon Cancer:mother Family History of Esophageal Cancer:  Brother Family History of Heart Disease: Father, Mother  Social History: Reviewed history from 05/03/2008 and no changes required. Patient currently smokes. 6 cigarettes daily married Alcohol Use - no Daily Caffeine Use Illicit Drug Use - no Patient gets regular exercise.  Review of Systems General:  Complains of malaise; denies fever, chills, sweats, anorexia, fatigue, weakness, weight loss, and sleep disorder.  Physical Exam  General:  Well developed, well nourished, no acute distress.healthy appearing.  healthy appearing.   Head:  Normocephalic and atraumatic. Eyes:  PERRLA, no icterus.exam deferred to patient's ophthalmologist.  exam deferred to patient's ophthalmologist.  Neck:  Supple; no masses or thyromegaly. Lungs:  Clear throughout to  auscultation. Heart:  Regular rate and rhythm; no murmurs, rubs,  or bruits. Abdomen:  Soft, nontender and nondistended. No masses, hepatosplenomegaly or hernias noted. Normal bowel sounds. Rectal:  soft profuse stool in the rectal vault which is markedly guaiac positive. Extremities:  No clubbing, cyanosis, edema or deformities noted. Neurologic:  Alert and  oriented x4;  grossly normal neurologically. Cervical Nodes:  No significant cervical adenopathy. Psych:  Alert and cooperative. Normal mood and affect.   Impression & Recommendations:  Problem # 1:  CROHN'S DISEASE-LARGE INTESTINE (ICD-555.1) Assessment Deteriorated Her symptomatology has always been atypical, rectal bleeding has been her main complaint, but she has been able to maintain a normal hemoglobin. Repeat labs have been ordered and I will perform followup colonoscopy and endoscopy. I have added probiotic therapy to her regime and also Rowasa 4 g enemas at bedtime.  Problem # 2:  RECTAL BLEEDING (ICD-569.3) Assessment: Deteriorated  Orders: Colon/Endo (Colon/Endo) TLB-B12, Serum-Total ONLY (16109-U04) TLB-BMP (Basic Metabolic Panel-BMET) (80048-METABOL) TLB-CBC Platelet - w/Differential (85025-CBCD) TLB-Ferritin (82728-FER) TLB-Folic Acid (Folate) (82746-FOL) TLB-Hepatic/Liver Function Pnl (80076-HEPATIC) TLB-IBC Pnl (Iron/FE;Transferrin) (83550-IBC) TLB-Sedimentation Rate (ESR) (85652-ESR) TLB-TSH (Thyroid Stimulating Hormone) (84443-TSH)  Problem # 3:  ARTHRITIS, RHEUMATOID (ICD-714.0) Assessment: Unchanged  Problem # 4:  BARRETTS ESOPHAGUS (ICD-530.85) Assessment: Unchanged we will restart daily PPI therapy and schedule endoscopy. Last endoscopic exam was 10 years ago.  Patient Instructions: 1)  Copy sent to : Truddie Crumble, MD 2)  Please go to the basement today for your labs.  3)  Your prescription(s) have been sent to you pharmacy.  4)  Your procedure has been scheduled for 09/03/2010, please follow  the seperate instructions.   5)  [facility] Patient Information Guide given to patient.  6)  Colonoscopy and Flexible Sigmoidoscopy brochure given.  7)  Upper Endoscopy brochure given.  8)  Avoid foods high in acid content ( tomatoes, citrus juices, spicy foods) . Avoid eating within 3 to 4 hours of lying down or before exercising. Do not over eat; try smaller more frequent meals. Elevate head of bed four inches when sleeping.  9)  Excessive Gas Diet handout given.  10)  The medication list was reviewed and reconciled.  All changed / newly prescribed medications were explained.  A complete medication list was provided to the patient / caregiver. Prescriptions: ROWASA 4 GM KIT (MESALAMINE-CLEANSER) insert into rectum nightly.  #1 month x 0   Entered by:   Harlow Mares CMA (AAMA)   Authorized by:   Mardella Layman MD [facility]   Signed by:   Mardella Layman MD [facility] on 08/30/2010   Method used:   [facility]        [facility]  [address] [phone]* (retail)       [address]       [place], [place]  [address]       Ph: [phone] or [phone]       Fax: [phone]   RxID:   [id number] MOVIPREP 100 GM  SOLR (PEG-KCL-NACL-NASULF-NA ASC-C) As per prep instructions.  #1 x 0   Entered by:   Harlow Mares CMA (AAMA)   Authorized by:   Mardella Layman MD [facility]   Signed by:   Mardella Layman MD [facility] on 08/30/2010   Method used:   [facility]        [facility]  [address] [phone]* (retail)       [address] [place], [place]  [address]       Ph: [phone] or [phone]       Fax: [phone]   RxID:   [id number]    Orders Added: 1)  Colon/Endo [Colon/Endo] 2)  TLB-B12, Serum-Total ONLY [82607-B12] 3)  TLB-BMP (Basic Metabolic Panel-BMET) [80048-METABOL] 4)  TLB-CBC Platelet - w/Differential [85025-CBCD] 5)  TLB-Ferritin [82728-FER] 6)  TLB-Folic Acid (Folate) [82746-FOL] 7)   TLB-Hepatic/Liver Function Pnl [80076-HEPATIC] 8)  TLB-IBC Pnl (Iron/FE;Transferrin) [83550-IBC] 9)  TLB-Sedimentation Rate (ESR) [85652-ESR] 10)  TLB-TSH (Thyroid Stimulating Hormone) [40347-QQV]

## 2010-09-05 NOTE — Letter (Signed)
Summary: [facility] Instructions  [facility]  [address] [place], [place] [address]   Phone: [phone]  Fax: [phone]       Brittney [name]    1931-08-01    MRN: [id number]        Procedure Day /Date: Monday 09/03/2010     Arrival Time: 2:30pm     Procedure Time: 3:30pm     Location of Procedure:                    X  [facility] (4th Floor)                     PREPARATION FOR COLONOSCOPY WITH MOVIPREP   Starting 5 days prior to your procedure TODAY do not eat nuts, seeds, popcorn, corn, beans, peas,  salads, or any raw vegetables.  Do not take any fiber supplements (e.g. Metamucil, Citrucel, and Benefiber).  THE DAY BEFORE YOUR PROCEDURE         Sunday 09/02/2010  1.  Drink clear liquids the entire day-NO SOLID FOOD  2.  Do not drink anything colored red or purple.  Avoid juices with pulp.  No orange juice.  3.  Drink at least 64 oz. (8 glasses) of fluid/clear liquids during the day to prevent dehydration and help the prep work efficiently.  CLEAR LIQUIDS INCLUDE: Water Jello Ice Popsicles Tea (sugar ok, no milk/cream) Powdered fruit flavored drinks Coffee (sugar ok, no milk/cream) Gatorade Juice: apple, white grape, white cranberry  Lemonade Clear bullion, consomm, broth Carbonated beverages (any kind) Strained chicken noodle soup Hard Candy                             4.  In the morning, mix first dose of MoviPrep solution:    Empty 1 Pouch A and 1 Pouch B into the disposable container    Add lukewarm drinking water to the top line of the container. Mix to dissolve    Refrigerate (mixed solution should be used within 24 hrs)  5.  Begin drinking the prep at 5:00 p.m. The MoviPrep container is divided by 4 marks.   Every 15 minutes drink the solution down to the next mark (approximately 8 oz) until the full liter is complete.   6.  Follow completed prep with 16 oz of clear liquid of your choice (Nothing red or purple).   Continue to drink clear liquids until bedtime.  7.  Before going to bed, mix second dose of MoviPrep solution:    Empty 1 Pouch A and 1 Pouch B into the disposable container    Add lukewarm drinking water to the top line of the container. Mix to dissolve    Refrigerate  THE DAY OF YOUR PROCEDURE      Monday 09/03/2010  Beginning at 10:30am (5 hours before procedure):         1. Every 15 minutes, drink the solution down to the next mark (approx 8 oz) until the full liter is complete.  2. Follow completed prep with 16 oz. of clear liquid of your choice.    3. You may drink clear liquids until 1:30pm (2 HOURS BEFORE PROCEDURE).   MEDICATION INSTRUCTIONS  Unless otherwise instructed, you should take regular prescription medications with a small sip of water   as early as possible the morning of your procedure.          OTHER INSTRUCTIONS  You will need a responsible adult at least 75 years of age to accompany you and drive you home.   This person must remain in the waiting room during your procedure.  Wear loose fitting clothing that is easily removed.  Leave jewelry and other valuables at home.  However, you may wish to bring a book to read or  an iPod/MP3 player to listen to music as you wait for your procedure to start.  Remove all body piercing jewelry and leave at home.  Total time from sign-in until discharge is approximately 2-3 hours.  You should go home directly after your procedure and rest.  You can resume normal activities the  day after your procedure.  The day of your procedure you should not:   Drive   Make legal decisions   Operate machinery   Drink alcohol   Return to work  You will receive specific instructions about eating, activities and medications before you leave.    The above instructions have been reviewed and explained to me by   _______________________    I fully understand and can verbalize these instructions  _____________________________ Date _________

## 2010-09-10 ENCOUNTER — Encounter (INDEPENDENT_AMBULATORY_CARE_PROVIDER_SITE_OTHER): Payer: Self-pay | Admitting: *Deleted

## 2010-09-10 ENCOUNTER — Telehealth: Payer: Self-pay | Admitting: Gastroenterology

## 2010-09-13 NOTE — Miscellaneous (Signed)
Summary: Orders Update/clotest  Clinical Lists Changes  Orders: Added new Test order of TLB-H Pylori Screen Gastric Biopsy (83013-CLOTEST) - Signed 

## 2010-09-13 NOTE — Progress Notes (Signed)
Summary: Procedure questions  Phone Note Call from Patient Call back at Home Phone [phone]   Caller: Patient Call For: Dr. Marina Goodell Reason for Call: Talk to Nurse Summary of Call: Pt has questions about procedure from yesterday Initial call taken by: [place] Johnson,  September 04, 2010 10:57 AM  Follow-up for Phone Call        Patient asked if the enema she received yesterday could make her BP high today. BP =160s/70s. I told her no. She stated that she has already called her PCP and is awaiting their callback to know what she needs to do. Advised her to call us back if we may be of any assistance. Follow-up by: Durwin Glaze RN,  September 04, 2010 11:41 AM

## 2010-09-13 NOTE — Progress Notes (Signed)
Summary: Triage  Phone Note Call from Patient Call back at Home Phone [phone]   Caller: Patient Call For: Dr. Jarold Motto Reason for Call: Talk to Nurse Summary of Call: wants to know if she continues using the enema at night Initial call taken by: Karna Christmas,  September 04, 2010 3:26 PM  Follow-up for Phone Call        All questions answered Follow-up by: Karl Bales RN,  September 04, 2010 4:30 PM

## 2010-09-13 NOTE — Progress Notes (Signed)
Summary: Question for nurse  Phone Note Call from Patient Call back at [phone] cell   Call For: Dr Jarold Motto Reason for Call: Talk to Nurse Summary of Call: Has a question for nurse. Was seen yesterday. Initial call taken by: Leanor Kail [facility],  August 31, 2010 11:14 AM  Follow-up for Phone Call        Patient questioned whether she should remain on Asacol when Dr Jarold Motto started her on Rowasa. I read the OV notes and informed her Dr Jarold Motto did not mention her stopping the Asacol and it is appropriate for more than 1 drug in this category to be prescribed at a time. Dr Jarold Motto is off this afternoon, but I will send him a note for 09/03/10- Patient will have ENDO/COLON that day as well. Patient stated understanding and will call for further problems.Graciella Freer RN  August 31, 2010 2:35 PM   Additional Follow-up for Phone Call Additional follow up Details #1::        exactly right Additional Follow-up by: Mardella Layman MD [facility],  September 03, 2010 9:10 AM

## 2010-09-18 ENCOUNTER — Ambulatory Visit (INDEPENDENT_AMBULATORY_CARE_PROVIDER_SITE_OTHER): Payer: Medicare Other | Admitting: Gastroenterology

## 2010-09-18 ENCOUNTER — Encounter: Payer: Self-pay | Admitting: Gastroenterology

## 2010-09-18 ENCOUNTER — Other Ambulatory Visit: Payer: Self-pay | Admitting: Gastroenterology

## 2010-09-18 ENCOUNTER — Encounter (INDEPENDENT_AMBULATORY_CARE_PROVIDER_SITE_OTHER): Payer: Self-pay | Admitting: *Deleted

## 2010-09-18 ENCOUNTER — Other Ambulatory Visit: Payer: Medicare Other

## 2010-09-18 DIAGNOSIS — K219 Gastro-esophageal reflux disease without esophagitis: Secondary | ICD-10-CM

## 2010-09-18 DIAGNOSIS — K551 Chronic vascular disorders of intestine: Secondary | ICD-10-CM

## 2010-09-18 LAB — IBC PANEL
Saturation Ratios: 23.3 % (ref 20.0–50.0)
Transferrin: 248.6 mg/dL (ref 212.0–360.0)

## 2010-09-18 LAB — FERRITIN: Ferritin: 388.8 ng/mL — ABNORMAL HIGH (ref 10.0–291.0)

## 2010-09-18 LAB — FOLATE: Folate: 10.6 ng/mL (ref >5.9)

## 2010-09-19 NOTE — Letter (Signed)
Summary: Patient Notice-Endo Biopsy Results  [facility]  [address] [place], [place] [address]   Phone: [phone]  Fax: [phone]        September 05, 2010 MRN: [id number]    Gerline Dhillon [address] [place], [place]  [address]    Dear Ms. Larinda Buttery,  I am pleased to inform you that the biopsies taken during your recent endoscopic examination did not show any evidence of cancer upon pathologic examination.  Additional information/recommendations:  __No further action is needed at this time.  Please follow-up with      your primary care physician for your other healthcare needs.  __ Please call [phone] to schedule a return visit to review      your condition.  X__ Continue with the treatment plan as outlined on the day of your      exam.BIOPSIES FOR H.PYLORI WERE NORMAL...  __ You should have a repeat endoscopic examination for this problem              in _ months/years.   Please call us if you are having persistent problems or have questions about your condition that have not been fully answered at this time.  Sincerely,  Mardella Layman MD [facility]  This letter has been electronically signed by your physician.

## 2010-09-19 NOTE — Letter (Signed)
Summary: Patient Notice- Polyp Results  [facility]  [address] [place], [place] [address]   Phone: [phone]  Fax: [phone]        September 10, 2010 MRN: [id number]    Adreanna Buzan [address] [place], [place]  [address]    Dear Ms. Larinda Buttery,  I am pleased to inform you that the colon polyp(s) removed during your recent colonoscopy was (were) found to be benign (no cancer detected) upon pathologic examination.  I recommend you have a repeat colonoscopy examination in 1_ years to look for recurrent polyps, as having colon polyps increases your risk for having recurrent polyps or even colon cancer in the future.  Should you develop new or worsening symptoms of abdominal pain, bowel habit changes or bleeding from the rectum or bowels, please schedule an evaluation with either your primary care physician or with me.  Additional information/recommendations:  __ No further action with gastroenterology is needed at this time. Please      follow-up with your primary care physician for your other healthcare      needs.  __ Please call [phone] to schedule a return visit to review your      situation.  __ Please keep your follow-up visit as already scheduled.  xx__ Continue treatment plan as outlined the day of your exam.  Please call us if you are having persistent problems or have questions about your condition that have not been fully answered at this time.  Sincerely,  Mardella Layman MD [facility]  This letter has been electronically signed by your physician.  Appended Document: Patient Notice- Polyp Results Letter Mailed

## 2010-09-19 NOTE — Procedures (Signed)
Summary: Endoscopy   EGD  Procedure date:  09/03/2010  Findings:      Location: [facility]   ENDOSCOPY PROCEDURE REPORT  PATIENT:  Brittney, [name]  MR#:  [id number] BIRTHDATE:   Feb 20, 1932, 78 yrs. old   GENDER:   female  ENDOSCOPIST:   Vania Rea. Jarold Motto, MD, [facility] Referred by:   PROCEDURE DATE:  09/03/2010 PROCEDURE:  EGD with biopsy, 46962 ASA CLASS:   Class II INDICATIONS: GERD   MEDICATIONS:    There was residual sedation effect present from prior procedure., Fentanyl 25 mcg IV, Versed 3 mg IV TOPICAL ANESTHETIC:    DESCRIPTION OF PROCEDURE:   After the risks benefits and alternatives of the procedure were thoroughly explained, informed consent was obtained.  The LB GIF-H180 [id number] endoscope was introduced through the mouth and advanced to the second portion of the duodenum, without limitations.  The instrument was slowly withdrawn as the mucosa was fully examined. <<PROCEDUREIMAGES>>    <<OLD IMAGES>>  A hiatal hernia was found. 5 CM. HH NOTED.NO STRICTURE OR BARRETT'S MUCOSA.  Mild gastritis was found in the antrum. CLO BX. DONE.    Retroflexed views revealed a hiatal hernia.    The scope was then withdrawn from the patient and the procedure completed.  COMPLICATIONS:   None  ENDOSCOPIC IMPRESSION:  1) Hiatal hernia  2) Mild gastritis in the antrum  PROMINANT HH AND CHRONIC GERD.R/O H.PYLORI,,, RECOMMENDATIONS:  1) Await biopsy results  2) Rx CLO if positive  CONTINUE PPI RX.  REPEAT EXAM:   No   _______________________________ Vania Rea. Jarold Motto, MD, [facility]    CC:

## 2010-09-19 NOTE — Procedures (Signed)
Summary: Colonoscopy   Colonoscopy  Procedure date:  09/03/2010  Findings:      Location:  [facility].   COLONOSCOPY PROCEDURE REPORT  PATIENT:  Brittney [name], Brittney [name]  MR#:  [id number] BIRTHDATE:   [birth year], 78 yrs. old   GENDER:   female ENDOSCOPIST:   Vania Rea. Jarold Motto, MD, [facility] REF. BY:  PROCEDURE DATE:  09/03/2010 PROCEDURE:  Colonoscopy with biopsy and snare polypectomy ASA CLASS:   Class II INDICATIONS: history of polyps, rectal bleeding  MEDICATIONS:    Fentanyl 75 mcg IV, Versed 7 mg IV  DESCRIPTION OF PROCEDURE:   After the risks benefits and alternatives of the procedure were thoroughly explained, informed consent was obtained.  Digital rectal exam was performed and revealed no abnormalities.   The LB [id number]-[id number] endoscope was introduced through the anus and advanced to the cecum, which was identified by both the appendix and ileocecal valve, limited by poor preparation.  VERY POOR PREP !!!!  The quality of the prep was poor, using MoviPrep.  The instrument was then slowly withdrawn as the colon was fully examined. <<PROCEDUREIMAGES>>          <<OLD IMAGES>>  FINDINGS:  Colitis was found. "SEGMENTAL COLITIS" IN AREA OF SEVERE DIVERTICULOSIS.SEE PICTURES.  There were multiple polyps identified and removed. in the sigmoid to descending colon segments. 2-5 MM FLAT POLYPS COLD SNARE EXCISED,,,   Retroflexed views in the rectum revealed no abnormalities.    The scope was then withdrawn from the patient and the procedure completed.  COMPLICATIONS:   None ENDOSCOPIC IMPRESSION:  1) Colitis  2) Polyps, multiple in the sigmoid to descending colon segments  R/O ADENOMAS.COLITIS SEEMS STABLE. RECOMMENDATIONS:  1) Await biopsy results  2) Repeat colonoscopy in 1 years if polyp adenomatous; otherwise 5 years  SAME MEDS REPEAT EXAM:   No   _______________________________ Vania Rea. Jarold Motto, MD, [facility]  CC:     Appended Document:  Colonoscopy     Procedures Next Due Date:    Colonoscopy: 08/2011

## 2010-09-19 NOTE — Progress Notes (Signed)
Summary: Questions  Phone Note Call from Patient Call back at Home Phone [phone]   Caller: Patient Call For: Dr. Jarold Motto Reason for Call: Talk to Nurse Summary of Call: Patient needs to speak with a nurse about her "lab results" and her rectal medicine Initial call taken by: [place] Johnson,  September 10, 2010 4:59 PM  Follow-up for Phone Call        she is seeing a little blood in her stools yesterday and today. she is still doing her rowasa nightly. she complains of a foul order still in her stools. she is having alot of gas as well. she is following a high fiber diet and taking all of her meds. She has an appt on 09/18/2010 Follow-up by: Harlow Mares CMA Duncan Dull),  September 11, 2010 8:11 AM  Additional Follow-up for Phone Call Additional follow up Details #1::        switch from align to [facility] twice a day for one month. Additional Follow-up by: Mardella Layman MD [facility]AC[facility],  September 11, 2010 9:04 AM    Additional Follow-up for Phone Call Additional follow up Details #2::    pt aware Follow-up by: Harlow Mares CMA (AAMA),  September 11, 2010 9:09 AM  New/Updated Medications: FLORASTOR 250 MG CAPS (SACCHAROMYCES BOULARDII) take one by mouth two times a day for one month Prescriptions: DEXILANT 60 MG CPDR (DEXLANSOPRAZOLE) take one by mouth once daily  #30 x 6   Entered by:   Harlow Mares CMA (AAMA)   Authorized by:   Mardella Layman MD [facility]   Signed by:   Harlow Mares CMA (AAMA) on 09/11/2010   Method used:   [facility]  [address] [phone][facility] (retail)       [address]       [place], [place]  [address]       Ph: [phone] or [phone]       Fax: [phone]   RxID:   [id number]

## 2010-09-25 NOTE — Assessment & Plan Note (Signed)
Summary: Follow up after procedures   History of Present Illness Visit Type: Follow-up Visit Primary GI MD: Sheryn Bison MD FACP FAGA Primary Provider: Truddie Crumble, MD Requesting Provider: n/a Chief Complaint: Follow up after procedures, No GI Complaints History of Present Illness:   Very Pleasant 77 year old [place] American female that I have followed for many years per her segmental colitis associated with severe diverticulosis. She also has a very strong family history of colon carcinoma. Recent colonoscopy was remarkable for active sigmoid colon colitis and removal additionally of multiple colon polyps.  Her main complaint today is of dry mouth and dry eyes. She apparently has had ENT consultation in the past. She has chronic thyroid dysfunction but has not been diagnosed with Sjogren's disease. Currently, her acid reflux is well controlled Dexilant 60 mg a day, and she is doing much better with a lower GI issues on Rowasa 4 g enemas at bedtime in addition to her Asacol 1.6 g twice a day.   GI Review of Systems      Denies abdominal pain, acid reflux, belching, bloating, chest pain, dysphagia with liquids, dysphagia with solids, heartburn, loss of appetite, nausea, vomiting, vomiting blood, weight loss, and  weight gain.        Denies anal fissure, black tarry stools, change in bowel habit, constipation, diarrhea, diverticulosis, fecal incontinence, heme positive stool, hemorrhoids, irritable bowel syndrome, jaundice, light color stool, liver problems, rectal bleeding, and  rectal pain.    Current Medications (verified): 1)  Synthroid 125 Mcg Tabs (Levothyroxine Sodium) .... Take One By Mouth Once Daily 2)  Diovan 320 Mg Tabs (Valsartan) .Marland Kitchen.. 1 By Mouth Once Daily 3)  Vitamin D 1000 Unit Tabs (Cholecalciferol) .... One Tablet By Mouth Two Times A Day 4)  Asacol Hd 800 Mg Tbec (Mesalamine) .... 2 Tabs Two Times A Day/ 5)  Moviprep 100 Gm  Solr (Peg-Kcl-Nacl-Nasulf-Na Asc-C)  .... As Per Prep Instructions. 6)  Rowasa 4 Gm Kit (Mesalamine-Cleanser) .... Insert Into Rectum Nightly. 7)  Florastor 250 Mg Caps (Saccharomyces Boulardii) .... Take One By Mouth Two Times A Day For One Month 8)  Dexilant 60 Mg Cpdr (Dexlansoprazole) .... Take One By Mouth Once Daily 9)  Calcium 600 600 Mg Tabs (Calcium Carbonate) .Marland Kitchen.. 1 By Mouth Once Daily  Allergies (verified): No Known Drug Allergies  Past History:  Family History: Last updated: 05/03/2008 Family History of Colon Cancer:mother Family History of Esophageal Cancer:  Brother Family History of Heart Disease: Father, Mother  Social History: Last updated: 05/03/2008 Patient currently smokes. 6 cigarettes daily married Alcohol Use - no Daily Caffeine Use Illicit Drug Use - no Patient gets regular exercise.  Past medical, surgical, family and social histories (including risk factors) reviewed for relevance to current acute and chronic problems.  Past Medical History: Reviewed history from 04/29/2008 and no changes required. Current Problems:  HYPOTHYROIDISM (ICD-244.9) ARTHRITIS, RHEUMATOID (ICD-714.0) HYPERLIPIDEMIA (ICD-272.4) CONSTIPATION, CHRONIC (ICD-564.09) BARRETTS ESOPHAGUS (ICD-530.85) GERD (ICD-530.81) HIATAL HERNIA (ICD-553.3) CROHN'S DISEASE (ICD-555.9) COLONIC POLYPS, ADENOMATOUS, HX OF (ICD-V12.72) SIGMOID POLYP (ICD-211.3) DIVERTICULOSIS, COLON (ICD-562.10)  Past Surgical History: Reviewed history from 04/29/2008 and no changes required. right knee arthroscopy  Family History: Reviewed history from 05/03/2008 and no changes required. Family History of Colon Cancer:mother Family History of Esophageal Cancer:  Brother Family History of Heart Disease: Father, Mother  Social History: Reviewed history from 05/03/2008 and no changes required. Patient currently smokes. 6 cigarettes daily married Alcohol Use - no Daily Caffeine Use Illicit Drug Use - no Patient gets  regular  exercise.  Review of Systems General:  Denies fever, chills, sweats, anorexia, fatigue, weakness, malaise, weight loss, and sleep disorder. ENT:  Denies earache, ear discharge, tinnitus, decreased hearing, nasal congestion, loss of smell, nosebleeds, sore throat, hoarseness, and difficulty swallowing; extremely dry mouth syndrome.Marland Kitchen Neuro:  Denies weakness, paralysis, abnormal sensation, seizures, syncope, tremors, vertigo, transient blindness, frequent falls, frequent headaches, difficulty walking, headache, sciatica, radiculopathy other:, restless legs, memory loss, and confusion.  Vital Signs:  Patient profile:   77 year old female Height:      65 inches Weight:      175 pounds BMI:     29.23 BSA:     1.87 Pulse rate:   80 / minute Pulse rhythm:   regular BP sitting:   130 / 78  (left arm)  Vitals Entered By: Merri Ray CMA Duncan Dull) (September 18, 2010 9:50 AM)  Physical Exam  General:  Well developed, well nourished, no acute distress.healthy appearing.  healthy appearing.   Head:  Normocephalic and atraumatic. Eyes:  PERRLA, no icterus.exam deferred to patient's ophthalmologist.  exam deferred to patient's ophthalmologist.   Mouth:  examination unremarkable with wet-appearing mucus membranes. Psych:  Alert and cooperative. Normal mood and affect.   Impression & Recommendations:  Problem # 1:  CROHN'S DISEASE-LARGE INTESTINE (ICD-555.1) Assessment Improved Continue all medications as listed and reviewed in her record. We will try to taper her Rowasa enemas to every 2 or 3 days as tolerated. She will need yearly colonoscopies and has recommended after her colonoscopy.  Problem # 2:  RECTAL BLEEDING (ICD-569.3) Assessment: Improved  Problem # 3:  ARTHRITIS, RHEUMATOID (ICD-714.0) Assessment: Unchanged labs ordered for review including B12, ANA, and celiac panel. It is unclear to me, but I do not think this patient has actually rheumatoid arthritis. She is not on any  immunosuppressive medication at this time.  Problem # 4:  HYPOTHYROIDISM (ICD-244.9) Assessment: Improved Possible associated Sjogren's syndrome. She may need repeat ENT evaluation.  Other Orders: TLB-B12, Serum-Total ONLY ([address]-U04) TLB-Ferritin (82728-FER) TLB-Folic Acid (Folate) (82746-FOL) TLB-IBC Pnl (Iron/FE;Transferrin) (83550-IBC) TLB-CRP-High Sensitivity (C-Reactive Protein) (86140-FCRP) TLB-Magnesium (Mg) (83735-MG) TLB-Sedimentation Rate (ESR) (85652-ESR) T-ANA (54098-11914) T-Amylase Isoenzymes (78295A2-13086) TLB-IgA (Immunoglobulin A) (82784-IGA) T-Sprue Panel (Celiac Disease Aby Eval) (83516x3/[address])  Patient Instructions: 1)  Your physician requests that you go to the basement floor of our office to have the following labwork completed before leaving today: Anemia Panel, CRP, Magnesium, Sedimentation Rate, ANA, Amylase Isoenzymes, IgA, Sprue Profile 2)  We have given you Dexilant samples to take. You should take 1 tablet by mouth once daily. 3)  Please pick up your prescriptions at the pharmacy. Electronic prescription(s) has already been sent for Asacol HD. You should continue to take 2 tablets by mouth two times a day. 4)  The medication list was reviewed and reconciled.  All changed / newly prescribed medications were explained.  A complete medication list was provided to the patient / caregiver. Prescriptions: ASACOL HD 800 MG TBEC (MESALAMINE) Take 2 tablets by mouth two times a day  #120 x 3   Entered by:   Lamona Curl CMA (AAMA)   Authorized by:   Mardella Layman MD [facility]   Signed by:   Lamona Curl CMA (AAMA) on 09/18/2010   Method used:   [facility]  [address] [phone][facility] (retail[facility]       [address]       [place]  [place], [place]  [address]       Ph: [phone] or [phone]       Fax: [phone]   RxID:   [id number]   Appended Document: Orders Update    Clinical Lists  Changes  Orders: Added new Test order of TLB-B12, Serum-Total ONLY [phone]) - Signed Added new Test order of TLB-Ferritin (82728-FER) - Signed Added new Test order of TLB-Folic Acid (Folate) (82746-FOL) - Signed Added new Test order of TLB-IBC Pnl (Iron/FE;Transferrin) (83550-IBC) - Signed Added new Test order of TLB-CRP-High Sensitivity (C-Reactive Protein) (86140-FCRP) - Signed Added new Test order of TLB-Magnesium (Mg) (83735-MG) - Signed Added new Test order of TLB-Sedimentation Rate (ESR) (85652-ESR) - Signed Added new Test order of T-ANA (02542-70623) - Signed Added new Test order of T-Amylase Isoenzymes (76283T5-17616) - Signed Added new Test order of T-Sprue Panel (Celiac Disease Aby Eval) (83516x3/[address]) - Signed Added new Test order of TLB-IgA (Immunoglobulin A) (82784-IGA) - Signed

## 2010-12-11 NOTE — Assessment & Plan Note (Signed)
[facility]                         GASTROENTEROLOGY OFFICE NOTE   NAME:Brittney [name], Brittney [name]                     MRN:          [id number]  DATE:10/01/2007                            DOB:          [birth year]    CHIEF COMPLAINT:  Bleeding.   Ms. Sowell walked into the office complaining of crampy abdominal pain  which is intermittent and blood in her stools.  She had called the  office (apparently) earlier in February and was given a prescription for  Cipro for problems.  She has a known problem with segmental colitis and  has had chronic recurrent bleeding like this from what she tells me and  review of the chart.  Her last colonoscopy was in July of 2008, which  revealed diverticulosis and a diminutive polyp with an adenomatous  polyp.  She did not have any active colitis at the time.  She is  maintained on Lialda for her inflammatory bowel disease problems.  She  reiterates to me that these problems she is having are very similar to  what she has had over the years, and she has seen Dr. Jarold Motto from at  least the 1990s.  She has not had nausea, vomiting, or fever.   MEDICATIONS:  Are listed and reviewed in the chart.   She has tried Anusol suppositories intermittently as well.   ALLERGIES:  Are listed and reviewed in the chart.   PROBLEM LIST:  Her problems include segmental colitis, diverticulosis,  adenomatous colon polyps, right knee arthroscopy.  Additional medical  problems include GERD, dyslipidemia, hypothyroidism, and hypertension.   FAMILY HISTORY:  Mother had colon cancer.   PHYSICAL EXAMINATION:  Well developed, well nourished, in no acute  distress.  Her pulse is strong.  ABDOMEN:  Soft, nontender.  Bowel sounds are present.  SKIN:  Warm and dry.  No acute rash.  RECTAL EXAM:  In the presence of female medical staff demonstrates a  mixture of blood and stool; mostly blood, which is maroon, though it is  dried.  She says she had 3  stools with blood today.  She is not  lightheaded.  She is not weak.  She is not dizzy.   ASSESSMENT:  Chronic recurrent bleeding.  Note that her blood pressure  was 112/70 and her pulse was 78.  She does not look unstable.  I am not  sure what the cause of this is, but she says she has had this problem  for a month or more, and it has been a chronic recurrent problem over  the years.   PLAN:  Will check a CBC and have her follow up with Dr. Jarold Motto  tomorrow as he is her regular gastroenterologist.  She knows to call the  answering service or go to the emergency  room if the bleeding picks up or is more brisk.  She may need another  colonoscopy, though she just had one 8 months ago.     Iva Boop, MD,FACG  Electronically Signed    CEG/MedQ  DD: 10/01/2007  DT: 10/01/2007  Job #: [id number]   cc:   Onalee Hua  Hale Bogus, MD, Caleen Essex, FAGA

## 2010-12-11 NOTE — Assessment & Plan Note (Signed)
[facility]                         GASTROENTEROLOGY OFFICE NOTE   NAME:Callies, SHATANA SAXTON                     MRN:          [id number]  DATE:10/22/2007                            DOB:          10-08-1931    Mrs. Mocarski underwent colonoscopy on October 07, 2007 with Dr. Leone Payor.  She had segmental colitis confirmed by biopsies but also removal of an 8-  mm sigmoid colon polyp.  She has been on Lialda 2.4 G twice a day and is  fairly asymptomatic at this point.   Mrs. Butz cannot afford Lialda and I have switched her back to Asacol  1.2 G twice a day and I have asked her to take Benefiber 1 tablespoon  with her cereal in the morning and liberal p.o. fluids.  Will continue  to follow her on a p.r.n. basis as needed.   Recent lab data on October 02, 2007, was entirely normal, slightly elevated  sed rate of 30.     Vania Rea. Jarold Motto, MD, Caleen Essex, FAGA  Electronically Signed    DRP/MedQ  DD: 10/22/2007  DT: 10/22/2007  Job #: [id number]   cc:   Iva Boop, MD,FACG

## 2010-12-11 NOTE — Assessment & Plan Note (Signed)
[facility]                         GASTROENTEROLOGY OFFICE NOTE   NAME:[name], Brittney MUELA                     MRN:          [id number]  DATE:10/02/2007                            DOB:          1931/08/01    Brittney [name] continues with some bright red blood per rectum and vague lower  abdominal cramping, without systemic complaints, such as fever or  chills, anorexia, weight-loss or arthralgias, etc.  She saw Dr. Leone Payor  yesterday and had a normal CBC with a hemoglobin of 13.2 and a normal  MCV.   Brittney [name] has known severe sigmoid colon diverticulosis with a long  history of segmental colitis.  Her colonoscopy was last repeated on February 25, 2007, and was fairly unremarkable, except for a small polyp, which  was removed.   1. She takes Lialda 2.4 g a day.  2. Diovan 160 mg a day.  3. Synthroid 125 mcg a day.  4. Crestor 10 mg a day.  5. Protonix 40 mg a day.   On reviewing her chart, I cannot see where she has had previous  abdominal CT scan.   She is awake and alert, in no acute distress.  Her weight is 171 pounds and blood pressure 130/80 and pulse was 66 and  regular.  Her abdominal exam was unremarkable, as was inspection of the rectum.  Rectal exam did show some maroonish type stool, which was marked guaiac-  positive.   ASSESSMENT:  I suspect Brittney [name] is having recurrent diverticular  bleeding.  We have been rather aggressive in following up her  colonoscopy exams, but her course is somewhat atypical.  Last time I  looked, her segmental colitis was quiescent.   RECOMMENDATIONS:  1. Repeat CBC, sed rate and CRP and iron studies.  2. Continue current medications, low-fiber diet.  3. Colonoscopy with Dr. Leone Payor next week.  4. Consider abdominopelvic CT scan and perhaps sigmoid resection,      depending on her      workup and clinical course.  5. Continue all other medications as listed above.     Vania Rea. Jarold Motto, MD, Caleen Essex,  FAGA  Electronically Signed    DRP/MedQ  DD: 10/02/2007  DT: 10/02/2007  Job #: [id number]   cc:   Iva Boop, MD,FACG  Truddie Crumble

## 2010-12-11 NOTE — Assessment & Plan Note (Signed)
[facility]                         GASTROENTEROLOGY OFFICE NOTE   NAME:Pourciau, TEMISHA MURLEY                     MRN:          [id number]  DATE:07/07/2007                            DOB:          [birth year]    Doneen has been doing well on Lialda 2.4 grams a day for her segmental  colitis.  She had some straining last week and had a little bit of fresh  blood.  She otherwise is doing well and denies general medical,  systemic, or gastrointestinal problems.  Her last colonoscopy was  performed in July of 2008 and was unremarkable except for  diverticulosis.  I really could not see any definite colitis at that  time.  She did have a small adenomatous polyp in size.   The patient does take fiber supplements but continues to have some  slight constipation.  She takes daily Protonix 40 mg for her acid  reflux.  Is also on Crestor 10 mg a day, Synthroid 125 mcg a day, Diovan  160 mg a day, and Calcium 1000 mg a day.   She is an attractive healthy appearing black female appearing younger  than her stated age.  She weighs 171 pounds.  Her blood pressure is  128/80 and pulse was 70 and regular.  I could not appreciate  hepatosplenomegaly, abdominal masses, tenderness, or distention.  Inspection of the rectum also was unremarkable as was rectal exam.  There was profuse soft stool in the rectal vault of normal color which  was guaiac negative.   ASSESSMENT:  1. Segmental colitis associated with severe diverticulosis.  This is      in remission on aminosalicylate therapy.  2. Probable internal hemorrhoidal bleeding.  3. Chronic constipation.   RECOMMENDATIONS:  1. Continue fiber supplements, liberal p.o. fluids.  2. Check laboratory parameters.  3. Anusol-HC suppositories on a p.r.n. basis.  4. Continue all other medications listed above.     Vania Rea. Jarold Motto, MD, Caleen Essex, FAGA  Electronically Signed    DRP/MedQ  DD: 07/07/2007  DT: 07/07/2007   Job #: [id number]

## 2010-12-14 NOTE — Assessment & Plan Note (Signed)
[facility]                           GASTROENTEROLOGY OFFICE NOTE   NAME:[name], Brittney VELADOR                     MRN:          [id number]  DATE:04/08/2006                            DOB:          [birth year]    Brittney [name] is doing well but has occasional bright red blood per rectum.  She  has known Crohn colitis and is doing well but is only taking 800 mg of  Asacol twice a day.  She denies systemic or generalized medical problems  otherwise.   Her vital signs are all normal, as is her abdominal exam.  She did not wish  a rectal exam today.   I will have Brittney [name] send [place] blood work that she had done recently at her  primary care office.  I have given her Hemoccult cards to return.  She is  due for colonoscopy follow-up next year.  I switched her to Lialda 1.2 g two  tablets daily with continued p.r.n. Pamine Forte use.  I have asked her to  check back with me in 3 months' time or p.r.n. as needed.                                   Vania Rea. Jarold Motto, MD, Clementeen Graham, [place]   DRP/MedQ  DD:  04/08/2006  DT:  04/09/2006  Job #:  [id number]

## 2010-12-14 NOTE — Assessment & Plan Note (Signed)
[facility]                         GASTROENTEROLOGY OFFICE NOTE   NAME:Brittney [name], Brittney [name]                     MRN:          [id number]  DATE:07/18/2006                            DOB:          26-Jan-1932    Brittney [name] is really doing fairly well, but has seen some heme in her stool  and is having some cramping and rectal urgency.  She has no upper GI,  hepatobiliary or systemic complaints.  She did not get Lialda as  previously recommended, because she thought it had aspirin in it.  She  is only taking Asacol 800 mg twice a day, along with Protonix 40 mg a  day.  She has known segmental colitis and diverticulosis and it has been  suspected that she has Crohn's colitis.  She is due for followup  colonoscopy in the next several months.  Last labs in August of 2006  were unremarkable.   She is a healthy-appearing black female, appearing her stated age, in no  distress.  She weighs 174 pounds, her blood pressure is 124/68, pulse  was 62 and regular.  Her abdominal exam was entirely normal, without  organomegaly, masses or tenderness.  Bowel sounds were normal.  Inspection of the rectum was unremarkable, as was rectal exam.  There  was formed stool in the rectal vault that was guaiac-positive.   RECOMMENDATIONS:  1. Check screening lab parameters, including sed rate.  2. Change the Lialda 1.2 g twice a day.  3. Canasa 1 g suppositories at bedtime.  4. Discontinue Asacol.  5. Colonoscopy in two weeks' time.     Vania Rea. Jarold Motto, MD, Caleen Essex, FAGA  Electronically Signed    DRP/MedQ  DD: 07/18/2006  DT: 07/18/2006  Job #: [id number]

## 2011-01-31 ENCOUNTER — Telehealth: Payer: Self-pay | Admitting: Gastroenterology

## 2011-01-31 MED ORDER — MESALAMINE 400 MG PO TBEC: 800.0000 mg | DELAYED_RELEASE_TABLET | Freq: Three times a day (TID) | ORAL | Status: DC

## 2011-01-31 NOTE — Telephone Encounter (Signed)
Left samples at the front desk.

## 2011-02-14 ENCOUNTER — Telehealth: Payer: Self-pay | Admitting: Gastroenterology

## 2011-02-14 MED ORDER — MESALAMINE 1000 MG RE SUPP: RECTAL | Status: DC

## 2011-02-14 MED ORDER — MESALAMINE 4 G RE ENEM: 4.0000 g | ENEMA | Freq: Every day | RECTAL | Status: DC

## 2011-02-14 NOTE — Telephone Encounter (Signed)
canasa qam and rowasa 4g enemas qhs for 10 days...then see me

## 2011-02-14 NOTE — Telephone Encounter (Signed)
Pt reports BRB IN her stool for 2 days. Last week she noticed dark blood in her stools. She has not had diarrhea in 3-4 days, denies a temp. Reports cramping in her stomach and at first thought she ate the wrong thing. Pt has Anusol HC suppositories she will try until she hears otherwise. Pt with hx of Segmental Colitis associated with severe Diverticulosis. Last ECL 09/03/10 revealing HH, Chronic GERD, Active Colitis, Tubular Adenoma on path. Pt wants to know if you want her to start on Rowasa suppositories again?  BY the way, she says she's your favorite pt!

## 2011-02-14 NOTE — Telephone Encounter (Signed)
lmom for pt to call back to inform us of pharmacy preference.

## 2011-02-14 NOTE — Telephone Encounter (Signed)
Sent scripts to [facility] [address] per pt request. Also informed her of her appt with Dr Jarold Motto on 03/05/11 at 0845am; pt stated understanding.

## 2011-02-15 ENCOUNTER — Telehealth: Payer: Self-pay | Admitting: Gastroenterology

## 2011-02-15 NOTE — Telephone Encounter (Signed)
Pt called to report she couldn't afford the scripts- $1500 for 10 days of tx. She reports no bleeding since yesterday, she thinks the problem began when she got constipated after a 3 day trip. Pt has used the Anusol [facility] suppositories and she still has some of those- she will call if not ok.

## 2011-02-18 NOTE — Telephone Encounter (Signed)
Prn annusol supp qhs are ok.Marland KitchenMarland Kitchen

## 2011-02-18 NOTE — Telephone Encounter (Signed)
Informed pt Dr Jarold Motto stated the PRN Anusol suppositories are OK to use for the intermittent bleeding; pt stated understanding.

## 2011-02-18 NOTE — Telephone Encounter (Signed)
Dr Jarold Motto, pt reports small amount of blood yesterday, and none today in her stool. She couldn't afford the Canasa or Rowasa. She was placed on Prednisone for a hip problem- wants to know if that will help? Also, I found samples of Canasa- 21 suppositories- can she use these BID? Please advise.

## 2011-02-19 ENCOUNTER — Other Ambulatory Visit: Payer: Self-pay | Admitting: Internal Medicine

## 2011-02-19 DIAGNOSIS — Z1231 Encounter for screening mammogram for malignant neoplasm of breast: Secondary | ICD-10-CM

## 2011-03-05 ENCOUNTER — Ambulatory Visit: Payer: Medicare Other | Admitting: Gastroenterology

## 2011-03-15 ENCOUNTER — Other Ambulatory Visit: Payer: Self-pay | Admitting: Internal Medicine

## 2011-03-15 ENCOUNTER — Ambulatory Visit
Admission: RE | Admit: 2011-03-15 | Discharge: 2011-03-15 | Disposition: A | Discharge status: Home / Self Care | Payer: Medicare Other | Source: Ambulatory Visit | Attending: Internal Medicine | Admitting: Internal Medicine

## 2011-03-15 DIAGNOSIS — Z1231 Encounter for screening mammogram for malignant neoplasm of breast: Secondary | ICD-10-CM

## 2011-03-15 DIAGNOSIS — N6310 Unspecified lump in the right breast, unspecified quadrant: Secondary | ICD-10-CM

## 2011-03-19 ENCOUNTER — Encounter: Payer: Self-pay | Admitting: Gastroenterology

## 2011-03-19 ENCOUNTER — Other Ambulatory Visit (INDEPENDENT_AMBULATORY_CARE_PROVIDER_SITE_OTHER): Payer: Medicare Other

## 2011-03-19 ENCOUNTER — Ambulatory Visit (INDEPENDENT_AMBULATORY_CARE_PROVIDER_SITE_OTHER): Payer: Medicare Other | Admitting: Gastroenterology

## 2011-03-19 VITALS — BP 132/80 | HR 64 | Ht 65.0 in | Wt 173.0 lb

## 2011-03-19 DIAGNOSIS — K219 Gastro-esophageal reflux disease without esophagitis: Secondary | ICD-10-CM

## 2011-03-19 DIAGNOSIS — K5010 Crohn's disease of large intestine without complications: Secondary | ICD-10-CM

## 2011-03-19 DIAGNOSIS — K5730 Diverticulosis of large intestine without perforation or abscess without bleeding: Secondary | ICD-10-CM

## 2011-03-19 DIAGNOSIS — K5090 Crohn's disease, unspecified, without complications: Secondary | ICD-10-CM

## 2011-03-19 LAB — CBC WITH DIFFERENTIAL/PLATELET
Eosinophils Relative: 6.2 % — ABNORMAL HIGH (ref 0.0–5.0)
Lymphocytes Relative: 22.3 % (ref 12.0–46.0)
MCV: 91 fl (ref 78.0–100.0)
Monocytes Absolute: 0.4 10*3/uL (ref 0.1–1.0)
Monocytes Relative: 7.5 % (ref 3.0–12.0)
Neutrophils Relative %: 63.6 % (ref 43.0–77.0)
Platelets: 265 10*3/uL (ref 150.0–400.0)
WBC: 5.2 10*3/uL (ref 4.5–10.5)

## 2011-03-19 LAB — HIGH SENSITIVITY CRP: CRP, High Sensitivity: 2.7 mg/L (ref 0.000–5.000)

## 2011-03-19 LAB — SEDIMENTATION RATE: Sed Rate: 30 mm/hr — ABNORMAL HIGH (ref 0–22)

## 2011-03-19 MED ORDER — MESALAMINE 1000 MG RE SUPP: RECTAL | Status: DC

## 2011-03-19 MED ORDER — MESALAMINE 400 MG PO TBEC: 800.0000 mg | DELAYED_RELEASE_TABLET | Freq: Two times a day (BID) | ORAL | Status: DC

## 2011-03-19 MED ORDER — DEXLANSOPRAZOLE 60 MG PO CPDR: 60.0000 mg | DELAYED_RELEASE_CAPSULE | Freq: Every day | ORAL | Status: DC

## 2011-03-19 NOTE — Progress Notes (Signed)
This is a 77 year old African American female with severe diverticulosis and associated segmental colitis with recurrent rectal bleeding. Last colonoscopy was in February of this year. She chronically is on oral Asacol with when necessary Canasa 1 g suppositories. She denies abdominal pain, upper GI, hepatobiliary or systemic complaints. She also has chronic GERD and uses PPI therapy as needed. The patient has had compliance problems with her medications for many years and with her appointments.  Current Medications, Allergies, Past Medical History, Past Surgical History, Family History and Social History were reviewed in [employer] record.  Pertinent Review of Systems Negative   Physical Exam: Attractive patient appearing much younger than her stated age in no acute distress. I cannot appreciate stigmata of chronic liver disease. Chest is clear cardiac exam shows no murmurs, gallops, or rubs. Chest is clear. There is no splenomegaly, abdominal masses or tenderness. Rectal extremities unremarkable. Mental status is normal.    Assessment and Plan: This patient is doing well on her current regime. However near medications and urged compliance. We will do a full colonoscopy in February with propofol sedation. CBC, anemia profile, CRP and sedimentation rate ordered. Encounter Diagnosis  Name Primary?  . Crohn disease Yes   And segmental

## 2011-03-19 NOTE — Patient Instructions (Signed)
Please go to the basement today for your labs.  Your prescription(s) have been sent to you pharmacy.  Your colonoscopy is due in Feb 2013, we will mail you a reminder about scheduling.

## 2011-03-22 ENCOUNTER — Ambulatory Visit
Admission: RE | Admit: 2011-03-22 | Discharge: 2011-03-22 | Disposition: A | Discharge status: Home / Self Care | Payer: Medicare Other | Source: Ambulatory Visit | Attending: Internal Medicine | Admitting: Internal Medicine

## 2011-03-22 DIAGNOSIS — N6310 Unspecified lump in the right breast, unspecified quadrant: Secondary | ICD-10-CM

## 2011-08-05 ENCOUNTER — Other Ambulatory Visit: Payer: Self-pay | Admitting: Internal Medicine

## 2011-08-05 DIAGNOSIS — Z1231 Encounter for screening mammogram for malignant neoplasm of breast: Secondary | ICD-10-CM

## 2011-08-21 ENCOUNTER — Ambulatory Visit
Admission: RE | Admit: 2011-08-21 | Discharge: 2011-08-21 | Disposition: A | Discharge status: Home / Self Care | Payer: Medicare Other | Source: Ambulatory Visit | Attending: Internal Medicine | Admitting: Internal Medicine

## 2011-08-21 DIAGNOSIS — Z1231 Encounter for screening mammogram for malignant neoplasm of breast: Secondary | ICD-10-CM

## 2011-08-29 ENCOUNTER — Other Ambulatory Visit: Payer: Self-pay | Admitting: Internal Medicine

## 2011-08-29 DIAGNOSIS — R928 Other abnormal and inconclusive findings on diagnostic imaging of breast: Secondary | ICD-10-CM

## 2011-09-04 ENCOUNTER — Ambulatory Visit
Admission: RE | Admit: 2011-09-04 | Discharge: 2011-09-04 | Disposition: A | Discharge status: Home / Self Care | Payer: Medicare Other | Source: Ambulatory Visit | Attending: Internal Medicine | Admitting: Internal Medicine

## 2011-09-04 DIAGNOSIS — R928 Other abnormal and inconclusive findings on diagnostic imaging of breast: Secondary | ICD-10-CM

## 2011-10-15 ENCOUNTER — Ambulatory Visit (INDEPENDENT_AMBULATORY_CARE_PROVIDER_SITE_OTHER): Payer: Medicare Other | Admitting: Physician Assistant

## 2011-10-15 ENCOUNTER — Other Ambulatory Visit (INDEPENDENT_AMBULATORY_CARE_PROVIDER_SITE_OTHER): Payer: Medicare Other

## 2011-10-15 ENCOUNTER — Telehealth: Payer: Self-pay | Admitting: Gastroenterology

## 2011-10-15 ENCOUNTER — Encounter: Payer: Self-pay | Admitting: Physician Assistant

## 2011-10-15 VITALS — BP 136/78 | HR 88 | Ht 65.0 in | Wt 172.4 lb

## 2011-10-15 DIAGNOSIS — K529 Noninfective gastroenteritis and colitis, unspecified: Secondary | ICD-10-CM

## 2011-10-15 DIAGNOSIS — K5010 Crohn's disease of large intestine without complications: Secondary | ICD-10-CM

## 2011-10-15 DIAGNOSIS — R109 Unspecified abdominal pain: Secondary | ICD-10-CM

## 2011-10-15 DIAGNOSIS — R197 Diarrhea, unspecified: Secondary | ICD-10-CM

## 2011-10-15 LAB — CBC WITH DIFFERENTIAL/PLATELET
Basophils Relative: 0.5 % (ref 0.0–3.0)
Eosinophils Relative: 4 % (ref 0.0–5.0)
HCT: 39 % (ref 36.0–46.0)
Hemoglobin: 12.8 g/dL (ref 12.0–15.0)
Lymphs Abs: 1.3 10*3/uL (ref 0.7–4.0)
MCV: 89.9 fl (ref 78.0–100.0)
Monocytes Absolute: 0.6 10*3/uL (ref 0.1–1.0)
Neutro Abs: 5.5 10*3/uL (ref 1.4–7.7)
RBC: 4.33 Mil/uL (ref 3.87–5.11)
WBC: 7.8 10*3/uL (ref 4.5–10.5)

## 2011-10-15 MED ORDER — ESOMEPRAZOLE MAGNESIUM 40 MG PO CPDR: 40.0000 mg | DELAYED_RELEASE_CAPSULE | Freq: Every day | ORAL | Status: DC

## 2011-10-15 MED ORDER — MESALAMINE 400 MG PO TBEC: DELAYED_RELEASE_TABLET | ORAL | Status: DC

## 2011-10-15 MED ORDER — ALIGN PO CAPS: 1.0000 | ORAL_CAPSULE | Freq: Every day | ORAL | Status: DC

## 2011-10-15 MED ORDER — DICYCLOMINE HCL 10 MG PO CAPS: ORAL_CAPSULE | ORAL | Status: DC

## 2011-10-15 NOTE — Patient Instructions (Signed)
Please go to the basement level to have your labs drawn.  We sent prescriptions to [facility] [address] for Asacol 400 mg, Bentyl 10 mg. We have given you samples of Align and Nexium.  Make a follow up appointment with Dr. Jarold Motto for 2-3 weeks out.

## 2011-10-15 NOTE — Telephone Encounter (Signed)
Pt with hx of diverticulitis reports soft foul-smelling stools. She reports she was on Amoxicillin x 12 days for a sinus infection and her stools changed from normal to soft back to normal again. She reports cramping and chills as well. Her appt was changed to today with Mike Gip , PA per Dr Jarold Motto for possible CDIFF. Pt will come in at 3:30pm today.

## 2011-10-16 ENCOUNTER — Encounter: Payer: Self-pay | Admitting: Physician Assistant

## 2011-10-16 ENCOUNTER — Other Ambulatory Visit: Payer: Medicare Other

## 2011-10-16 DIAGNOSIS — R109 Unspecified abdominal pain: Secondary | ICD-10-CM

## 2011-10-16 DIAGNOSIS — K529 Noninfective gastroenteritis and colitis, unspecified: Secondary | ICD-10-CM

## 2011-10-16 NOTE — Progress Notes (Signed)
Subjective:    Patient ID: Brittney [name], female    DOB: 1932-04-13, 78 y.o.   MRN: [id number]  HPI Miakoda is a very nice 78 year old female known to Dr. Jarold Motto who has a diagnosis of segmental colitis/diverticular disease and is felt to have possible segmental Crohn's. She last had colonoscopy in February 2012 and at that time had a fairly severe left-sided segmental colitis, however biopsies were negative for any evidence of inflammatory bowel disease. She has been treated with a maintenance dose of Asacol 400 mg 2 by mouth 3 times a day which has worked well. She admits at this point that prior to onset of her current symptoms she was only taking one tablet per day  She comes in today with complaints of change in her bowel habits over the past 3 weeks with looser more mucoid stools. After onset of loose stool she then developed a sinus infection and was treated with a course of amoxicillin which she said made her symptoms worse. She says she went off of her Asacol while she was taking the antibiotics because she felt like she was taking too much medicine now she is having more frequent bowel movements with 4 or 5 stools per day which continued to be loose and mucoid and malodorous. She is not having any significant rectal bleeding. She also complains of intermittent abdominal cramping in her midabdomen and left abdomen he has not had any nausea or vomiting her appetite has been fair. There is been no fever or chills.    Review of Systems  Constitutional: Negative.   HENT: Negative.   Eyes: Negative.   Respiratory: Negative.   Cardiovascular: Negative.   Gastrointestinal: Positive for abdominal pain, diarrhea and blood in stool.  Genitourinary: Negative.   Musculoskeletal: Negative.   Neurological: Negative.   Hematological: Negative.   Psychiatric/Behavioral: Negative.    Outpatient Encounter Prescriptions as of 10/15/2011  Medication Sig Dispense Refill  . calcium carbonate (OS-CAL)  600 MG TABS Take 600 mg by mouth 2 (two) times daily with a meal.        . Cholecalciferol (VITAMIN D3) 1000 UNITS CAPS Take 1 capsule by mouth 2 (two) times daily.        Marland Kitchen levothyroxine (SYNTHROID, LEVOTHROID) 125 MCG tablet Take 125 mcg by mouth daily.        . mesalamine (ASACOL) 400 MG EC tablet 3 tabs 3 times daily, total of 9.  270 tablet  6  . rosuvastatin (CRESTOR) 10 MG tablet Take 10 mg by mouth daily.      . valsartan (DIOVAN) 320 MG tablet Take 160 mg by mouth daily.       Marland Kitchen DISCONTD: mesalamine (ASACOL) 400 MG EC tablet Take 2 tablets (800 mg total) by mouth 2 (two) times daily.  120 tablet  6  . bifidobacterium infantis (ALIGN) capsule Take 1 capsule by mouth daily.  14 capsule  0  . dicyclomine (BENTYL) 10 MG capsule Take 1 tab 3 times a day as needed for cramping.  90 capsule  0  . esomeprazole (NEXIUM) 40 MG capsule Take 1 capsule (40 mg total) by mouth daily.  30 capsule  1  . DISCONTD: dexlansoprazole (DEXILANT) 60 MG capsule Take 1 capsule (60 mg total) by mouth daily.  60 capsule  6  . DISCONTD: mesalamine (CANASA) 1000 MG suppository Insert one suppository in rectum each morning  30 suppository  6  . DISCONTD: mesalamine (ROWASA) 4 G enema Place 60 mLs (4 g total)  rectally at bedtime.  10 Bottle  0      No Known Allergies  Objective:   Physical Exam well-developed older [place] American female, in no acute distress pleasant. Blood pressure 136/78 pulse 88. HEENT; nontraumatic, normocephalic, EOMI, PERRLA sclera anicteric,Neck; Supple no JVD, Cardiovascular; regular rate and rhythm with S1-S2 no murmur gallop, Pulmonary ;clear bilaterally, Abdomen; soft bowel sounds are active, she is mildly tender across her lower abdomen and in the left lower quadrant left mid quadrant no guarding no rebound no palpable mass or hepatosplenomegaly, Rectal; not done, Extremities; no clubbing cyanosis or edema skin warm dry, Psych ;mood and affect normal and appropriate.        Assessment  & Plan:  #30 78 year old female with segmental colitis/diverticulosis versus segmental Crohn's colitis generally maintained in remission with moderate dose Asacol now with acute exacerbation of symptoms over the past 3 weeks. This was in the setting of sinusitis antibiotic use and short-term discontinuation of her Asacol. I suspect she has had an exacerbation of her usual colitis, but will also need to rule out C. difficile colitis. #2 Chronic GERD/history of Barrett's esophagus  Plan; restart Asacol 400 mg, 3 tablets 3 times daily for a total of 9 per day. Add Bentyl 10 mg by mouth 2-3 times daily as needed for cramping Check stool for C. difficile PCR, stool culture. Patient will follow up with Dr. Jarold Motto in 2-3 weeks and knows to call in the interim if her symptoms worsen

## 2011-10-17 ENCOUNTER — Ambulatory Visit: Payer: Medicare Other | Admitting: Gastroenterology

## 2011-10-17 NOTE — Progress Notes (Signed)
I agree with assessment and plan.

## 2011-10-18 ENCOUNTER — Telehealth: Payer: Self-pay | Admitting: Gastroenterology

## 2011-10-18 NOTE — Telephone Encounter (Signed)
lmom for pt to call back

## 2011-10-20 LAB — STOOL CULTURE

## 2011-10-30 ENCOUNTER — Encounter: Payer: Self-pay | Admitting: *Deleted

## 2011-11-05 ENCOUNTER — Encounter: Payer: Self-pay | Admitting: Gastroenterology

## 2011-11-05 ENCOUNTER — Ambulatory Visit (INDEPENDENT_AMBULATORY_CARE_PROVIDER_SITE_OTHER): Payer: Medicare Other | Admitting: Gastroenterology

## 2011-11-05 VITALS — BP 128/64 | HR 80 | Ht 65.0 in | Wt 170.0 lb

## 2011-11-05 DIAGNOSIS — K5010 Crohn's disease of large intestine without complications: Secondary | ICD-10-CM

## 2011-11-05 DIAGNOSIS — R142 Eructation: Secondary | ICD-10-CM

## 2011-11-05 DIAGNOSIS — R141 Gas pain: Secondary | ICD-10-CM

## 2011-11-05 DIAGNOSIS — E739 Lactose intolerance, unspecified: Secondary | ICD-10-CM

## 2011-11-05 MED ORDER — MESALAMINE 400 MG PO CPDR: 3.0000 | DELAYED_RELEASE_CAPSULE | Freq: Three times a day (TID) | ORAL | Status: DC

## 2011-11-05 NOTE — Progress Notes (Signed)
This is a 78 year old African American female with chronic segmental colitis associated with severe diverticulosis. She also has had removal of benign colon polyps with last colonoscopy one year ago. She recently had increased lower abdominal discomfort and rectal bleeding, and her Asacol was increased to 3.6 g a day with good resolution of her problems. Lab review was unremarkable. She currently denies abdominal pain, diarrhea, rectal bleeding. She does use when necessary spasmodic and daily probiotics.  Current Medications, Allergies, Past Medical History, Past Surgical History, Family History and Social History were reviewed in [employer] record.  Pertinent Review of Systems Negative   Physical Exam: Healthy appearing patient in no distress. Blood pressure 128/64, pulse 80 and regular, and BMI 28.29. Chest is clear cardiac exam is unremarkable. Her abdomen shows no organomegaly, masses, tenderness, or abnormal bowel sounds. Mental status is normal.    Assessment and Plan: Segmental colitis responded well to increased doses of by mouth aminosialyicate. She complains of excessive gas, and does have lactose intolerance. She is to take Lactaid with her meals and as needed Beano and to avoid other nonabsorbable carbohydrates. I have placed her on a FODMAP diet to try. She will need followup colonoscopy in one years time. No diagnosis found.

## 2011-11-05 NOTE — Patient Instructions (Addendum)
The drug company is no longer making Asacal 400mg  so once [facility] is out the drug that will replace it is called Delzicol you will take this drug the same as you do your Asacol. We have sent you a new rx to be used when they need it.  Follow the artifical sweetener sheet given today.  Buy Beano and lactaid OTC and use as needed.  FODMAP diet given

## 2012-02-04 ENCOUNTER — Other Ambulatory Visit: Payer: Self-pay | Admitting: Internal Medicine

## 2012-02-04 DIAGNOSIS — N63 Unspecified lump in unspecified breast: Secondary | ICD-10-CM

## 2012-02-05 ENCOUNTER — Telehealth: Payer: Self-pay | Admitting: Gastroenterology

## 2012-02-05 NOTE — Telephone Encounter (Signed)
Advised patient that samples of Delzicol have been put at the front desk for her to pick up (6 boxes of 12 capsules). She verbalizes understanding.

## 2012-03-02 ENCOUNTER — Ambulatory Visit
Admission: RE | Admit: 2012-03-02 | Discharge: 2012-03-02 | Disposition: A | Discharge status: Home / Self Care | Payer: Medicare Other | Source: Ambulatory Visit | Attending: Internal Medicine | Admitting: Internal Medicine

## 2012-03-02 DIAGNOSIS — N63 Unspecified lump in unspecified breast: Secondary | ICD-10-CM

## 2012-03-18 ENCOUNTER — Other Ambulatory Visit: Payer: Self-pay | Admitting: Internal Medicine

## 2012-03-18 DIAGNOSIS — N6310 Unspecified lump in the right breast, unspecified quadrant: Secondary | ICD-10-CM

## 2012-03-29 HISTORY — PX: BREAST CYST ASPIRATION: SHX578

## 2012-04-02 ENCOUNTER — Ambulatory Visit
Admission: RE | Admit: 2012-04-02 | Discharge: 2012-04-02 | Disposition: A | Discharge status: Home / Self Care | Payer: Medicare Other | Source: Ambulatory Visit | Attending: Internal Medicine | Admitting: Internal Medicine

## 2012-04-02 ENCOUNTER — Other Ambulatory Visit: Payer: Self-pay | Admitting: Internal Medicine

## 2012-04-02 DIAGNOSIS — N6310 Unspecified lump in the right breast, unspecified quadrant: Secondary | ICD-10-CM

## 2012-04-03 ENCOUNTER — Other Ambulatory Visit: Payer: Self-pay | Admitting: Internal Medicine

## 2012-04-03 DIAGNOSIS — N63 Unspecified lump in unspecified breast: Secondary | ICD-10-CM

## 2012-04-16 ENCOUNTER — Telehealth: Payer: Self-pay | Admitting: *Deleted

## 2012-04-16 DIAGNOSIS — R109 Unspecified abdominal pain: Secondary | ICD-10-CM

## 2012-04-16 DIAGNOSIS — K529 Noninfective gastroenteritis and colitis, unspecified: Secondary | ICD-10-CM

## 2012-04-16 MED ORDER — ESOMEPRAZOLE MAGNESIUM 40 MG PO CPDR: 40.0000 mg | DELAYED_RELEASE_CAPSULE | Freq: Every day | ORAL | Status: DC

## 2012-04-16 MED ORDER — MESALAMINE 400 MG PO CPDR: 1200.0000 mg | DELAYED_RELEASE_CAPSULE | Freq: Three times a day (TID) | ORAL | Status: DC

## 2012-04-16 NOTE — Telephone Encounter (Signed)
Patient notified to pick up samples of Delzicol and asked for Nexium also and I gave her samples of it.

## 2012-05-19 ENCOUNTER — Encounter: Payer: Self-pay | Admitting: Gastroenterology

## 2012-05-22 ENCOUNTER — Emergency Department (HOSPITAL_COMMUNITY): Payer: No Typology Code available for payment source

## 2012-05-22 ENCOUNTER — Encounter (HOSPITAL_COMMUNITY): Payer: Self-pay | Admitting: Emergency Medicine

## 2012-05-22 ENCOUNTER — Observation Stay (HOSPITAL_COMMUNITY)
Admission: EM | Admit: 2012-05-22 | Discharge: 2012-05-24 | Disposition: A | Discharge status: Home / Self Care | Payer: No Typology Code available for payment source | Attending: Surgery | Admitting: Surgery

## 2012-05-22 DIAGNOSIS — M069 Rheumatoid arthritis, unspecified: Secondary | ICD-10-CM

## 2012-05-22 DIAGNOSIS — S36209A Unspecified injury of unspecified part of pancreas, initial encounter: Secondary | ICD-10-CM

## 2012-05-22 DIAGNOSIS — D126 Benign neoplasm of colon, unspecified: Secondary | ICD-10-CM

## 2012-05-22 DIAGNOSIS — K625 Hemorrhage of anus and rectum: Secondary | ICD-10-CM

## 2012-05-22 DIAGNOSIS — K5090 Crohn's disease, unspecified, without complications: Secondary | ICD-10-CM

## 2012-05-22 DIAGNOSIS — E039 Hypothyroidism, unspecified: Secondary | ICD-10-CM

## 2012-05-22 DIAGNOSIS — K5730 Diverticulosis of large intestine without perforation or abscess without bleeding: Secondary | ICD-10-CM

## 2012-05-22 DIAGNOSIS — Y92410 Unspecified street and highway as the place of occurrence of the external cause: Secondary | ICD-10-CM | POA: Insufficient documentation

## 2012-05-22 DIAGNOSIS — Z79899 Other long term (current) drug therapy: Secondary | ICD-10-CM | POA: Insufficient documentation

## 2012-05-22 DIAGNOSIS — E785 Hyperlipidemia, unspecified: Secondary | ICD-10-CM | POA: Insufficient documentation

## 2012-05-22 DIAGNOSIS — K449 Diaphragmatic hernia without obstruction or gangrene: Secondary | ICD-10-CM

## 2012-05-22 DIAGNOSIS — K5909 Other constipation: Secondary | ICD-10-CM

## 2012-05-22 DIAGNOSIS — R32 Unspecified urinary incontinence: Secondary | ICD-10-CM

## 2012-05-22 DIAGNOSIS — Z7902 Long term (current) use of antithrombotics/antiplatelets: Secondary | ICD-10-CM | POA: Insufficient documentation

## 2012-05-22 DIAGNOSIS — K5289 Other specified noninfective gastroenteritis and colitis: Secondary | ICD-10-CM

## 2012-05-22 DIAGNOSIS — S2220XA Unspecified fracture of sternum, initial encounter for closed fracture: Secondary | ICD-10-CM

## 2012-05-22 DIAGNOSIS — K5010 Crohn's disease of large intestine without complications: Secondary | ICD-10-CM

## 2012-05-22 DIAGNOSIS — K219 Gastro-esophageal reflux disease without esophagitis: Secondary | ICD-10-CM

## 2012-05-22 DIAGNOSIS — K2270 Barrett's esophagus without dysplasia: Secondary | ICD-10-CM

## 2012-05-22 HISTORY — DX: Pneumonia, unspecified organism: J18.9

## 2012-05-22 HISTORY — DX: Essential (primary) hypertension: I10

## 2012-05-22 HISTORY — DX: Headache: R51

## 2012-05-22 HISTORY — DX: Headache, unspecified: R51.9

## 2012-05-22 LAB — POCT I-STAT, CHEM 8
BUN: 16 mg/dL (ref 6–23)
Chloride: 106 mEq/L (ref 96–112)
Creatinine, Ser: 1.1 mg/dL (ref 0.50–1.10)
Sodium: 141 mEq/L (ref 135–145)
TCO2: 26 mmol/L (ref 0–100)

## 2012-05-22 LAB — CBC WITH DIFFERENTIAL/PLATELET
Eosinophils Absolute: 0.2 10*3/uL (ref 0.0–0.7)
Eosinophils Relative: 2 % (ref 0–5)
HCT: 36.7 % (ref 36.0–46.0)
Hemoglobin: 11.7 g/dL — ABNORMAL LOW (ref 12.0–15.0)
Lymphocytes Relative: 11 % — ABNORMAL LOW (ref 12–46)
Lymphs Abs: 0.9 10*3/uL (ref 0.7–4.0)
MCH: 28.3 pg (ref 26.0–34.0)
MCV: 88.9 fL (ref 78.0–100.0)
Monocytes Absolute: 0.6 10*3/uL (ref 0.1–1.0)
Monocytes Relative: 7 % (ref 3–12)
Platelets: 366 10*3/uL (ref 150–400)
RBC: 4.13 MIL/uL (ref 3.87–5.11)
WBC: 8.4 10*3/uL (ref 4.0–10.5)

## 2012-05-22 LAB — COMPREHENSIVE METABOLIC PANEL
ALT: 22 U/L (ref 0–35)
BUN: 12 mg/dL (ref 6–23)
CO2: 27 mEq/L (ref 19–32)
Calcium: 9.5 mg/dL (ref 8.4–10.5)
Creatinine, Ser: 0.84 mg/dL (ref 0.50–1.10)
GFR calc Af Amer: 75 mL/min — ABNORMAL LOW (ref >90)
GFR calc non Af Amer: 65 mL/min — ABNORMAL LOW (ref >90)
Glucose, Bld: 99 mg/dL (ref 70–99)
Total Protein: 8.3 g/dL (ref 6.0–8.3)

## 2012-05-22 LAB — LIPASE, BLOOD: Lipase: 135 U/L — ABNORMAL HIGH (ref 11–59)

## 2012-05-22 MED ORDER — HYDROCODONE-ACETAMINOPHEN 5-325 MG PO TABS
2.0000 | ORAL_TABLET | ORAL | Status: DC | PRN
  Administered 2012-05-22: 1 via ORAL
  Administered 2012-05-23 (×2): 2 via ORAL
  Filled 2012-05-22: qty 1
  Filled 2012-05-22: qty 2
  Filled 2012-05-22: qty 1
  Filled 2012-05-22 (×2): qty 2

## 2012-05-22 MED ORDER — IOHEXOL 300 MG/ML  SOLN
80.0000 mL | Freq: Once | INTRAMUSCULAR | Status: AC | PRN
  Administered 2012-05-22: 80 mL via INTRAVENOUS

## 2012-05-22 MED ORDER — ONDANSETRON HCL 4 MG PO TABS: 4.0000 mg | ORAL_TABLET | Freq: Four times a day (QID) | ORAL | Status: DC | PRN

## 2012-05-22 MED ORDER — KCL IN DEXTROSE-NACL 20-5-0.9 MEQ/L-%-% IV SOLN
INTRAVENOUS | Status: DC
  Administered 2012-05-22 – 2012-05-23 (×2): via INTRAVENOUS
  Filled 2012-05-22 (×3): qty 1000

## 2012-05-22 MED ORDER — ONDANSETRON HCL 4 MG/2ML IJ SOLN: 4.0000 mg | Freq: Four times a day (QID) | INTRAMUSCULAR | Status: DC | PRN

## 2012-05-22 MED ORDER — HYDROMORPHONE HCL PF 1 MG/ML IJ SOLN: 1.0000 mg | INTRAMUSCULAR | Status: DC | PRN

## 2012-05-22 MED ORDER — IBUPROFEN 800 MG PO TABS
800.0000 mg | ORAL_TABLET | Freq: Once | ORAL | Status: AC
  Administered 2012-05-22: 800 mg via ORAL
  Filled 2012-05-22: qty 1

## 2012-05-22 NOTE — ED Provider Notes (Signed)
Medical screening examination/treatment/procedure(s) were conducted as a shared visit with non-physician practitioner(s) and myself.  I personally evaluated the patient during the encounter On my exam the patient was in no distress.  Given her CP, the sternal fracture on CXR, she had a CT.  Given the findings, the patient was admitted for further E/M.  I saw the ECG, relevant labs and studies - I agree with the interpretation.    Gerhard Munch, MD 05/22/12 [phone]
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
## 2012-05-22 NOTE — ED Notes (Signed)
Pt not in room.
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
## 2012-05-22 NOTE — Progress Notes (Signed)
Patient admitted to 6N01.  Alert and oriented, telemetry monitor on.  Vital signs stable, family notified of transfer. Patient oriented to room.
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
## 2012-05-22 NOTE — ED Notes (Signed)
Patient transported to CT 

## 2012-05-23 ENCOUNTER — Encounter (HOSPITAL_COMMUNITY): Payer: Self-pay | Admitting: *Deleted

## 2012-05-23 LAB — CBC
HCT: 31.3 % — ABNORMAL LOW (ref 36.0–46.0)
Platelets: 302 10*3/uL (ref 150–400)
RBC: 3.49 MIL/uL — ABNORMAL LOW (ref 3.87–5.11)
RDW: 13.3 % (ref 11.5–15.5)
WBC: 4.8 10*3/uL (ref 4.0–10.5)

## 2012-05-23 LAB — LIPASE, BLOOD: Lipase: 143 U/L — ABNORMAL HIGH (ref 11–59)

## 2012-05-23 MED ORDER — IRBESARTAN 300 MG PO TABS
300.0000 mg | ORAL_TABLET | Freq: Every day | ORAL | Status: DC
  Filled 2012-05-23 (×2): qty 1

## 2012-05-23 MED ORDER — LEVOTHYROXINE SODIUM 125 MCG PO TABS: 125.0000 ug | ORAL_TABLET | Freq: Every day | ORAL | Status: DC

## 2012-05-23 MED ORDER — LEVOTHYROXINE SODIUM 125 MCG PO TABS
125.0000 ug | ORAL_TABLET | Freq: Every day | ORAL | Status: DC
  Administered 2012-05-23 – 2012-05-24 (×2): 125 ug via ORAL
  Filled 2012-05-23 (×3): qty 1

## 2012-05-23 MED ORDER — ENOXAPARIN SODIUM 40 MG/0.4ML ~~LOC~~ SOLN
40.0000 mg | SUBCUTANEOUS | Status: DC
  Administered 2012-05-23: 40 mg via SUBCUTANEOUS
  Filled 2012-05-23 (×2): qty 0.4
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
## 2012-05-24 LAB — CBC WITH DIFFERENTIAL/PLATELET
HCT: 33.6 % — ABNORMAL LOW (ref 36.0–46.0)
Hemoglobin: 10.7 g/dL — ABNORMAL LOW (ref 12.0–15.0)
Lymphocytes Relative: 25 % (ref 12–46)
Monocytes Absolute: 0.6 10*3/uL (ref 0.1–1.0)
Monocytes Relative: 10 % (ref 3–12)
Neutro Abs: 3.4 10*3/uL (ref 1.7–7.7)
WBC: 5.6 10*3/uL (ref 4.0–10.5)

## 2012-05-24 LAB — BASIC METABOLIC PANEL
BUN: 9 mg/dL (ref 6–23)
CO2: 24 mEq/L (ref 19–32)
Chloride: 101 mEq/L (ref 96–112)
GFR calc Af Amer: 68 mL/min — ABNORMAL LOW (ref >90)
Potassium: 4.1 mEq/L (ref 3.5–5.1)

## 2012-05-24 MED ORDER — HYDROCODONE-ACETAMINOPHEN 5-325 MG PO TABS: 2.0000 | ORAL_TABLET | ORAL | Status: DC | PRN
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
## 2012-06-03 ENCOUNTER — Telehealth (HOSPITAL_COMMUNITY): Payer: Self-pay | Admitting: General Surgery

## 2012-06-03 ENCOUNTER — Telehealth (INDEPENDENT_AMBULATORY_CARE_PROVIDER_SITE_OTHER): Payer: Self-pay | Admitting: General Surgery
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
## 2012-06-05 ENCOUNTER — Telehealth (HOSPITAL_COMMUNITY): Payer: Self-pay | Admitting: General Surgery

## 2012-06-16 ENCOUNTER — Telehealth: Payer: Self-pay | Admitting: Gastroenterology
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
## 2012-06-17 NOTE — Telephone Encounter (Signed)
???   In 1 year as per protocol

## 2012-06-19 NOTE — Telephone Encounter (Signed)
Spoke with Dr Jarold Motto who states pt may come in around February to discuss her last path and future COLON.

## 2012-06-19 NOTE — Telephone Encounter (Signed)
Informed pt I will call her in January, 2014 to schedule an appt in February; pt stated understanding. Reminder in.

## 2012-07-10 ENCOUNTER — Telehealth: Payer: Self-pay | Admitting: *Deleted

## 2012-07-10 ENCOUNTER — Telehealth: Payer: Self-pay | Admitting: Gastroenterology

## 2012-07-10 MED ORDER — MESALAMINE 400 MG PO CPDR: 1200.0000 mg | DELAYED_RELEASE_CAPSULE | Freq: Three times a day (TID) | ORAL | Status: DC | PRN

## 2012-07-10 NOTE — Telephone Encounter (Signed)
Left samples up front for patient.  Called patient and she was advised that I left samples out front for her.  Patient verbalized understanding.

## 2012-07-10 NOTE — Telephone Encounter (Signed)
Patient came in to get her samples and told me that she was having some rectal bleeding and said that she does not want to make an appointment at this time.  I advised to patient that if bleeding persist or is worse this weekend then she needs to call and talk to Dr. Norval Gable nurse Aram Beecham and make an appointment to see Dr. Jarold Motto.  Patient verbalized understanding.

## 2012-07-13 ENCOUNTER — Telehealth: Payer: Self-pay | Admitting: *Deleted

## 2012-07-13 MED ORDER — MESALAMINE 4 G RE ENEM: 4.0000 g | ENEMA | Freq: Every day | RECTAL | Status: DC

## 2012-07-13 NOTE — Telephone Encounter (Signed)
Add rowasa 4 g enemas at bedtime

## 2012-07-13 NOTE — Telephone Encounter (Signed)
Pt called Friday, 07/10/12, and reported to Ok Anis, CMA that she was having a flare and if no better, she would call today. This am pt reports for > 5 days she has seen blood in her stool, but none on the tissue when wiping. Last seen 11/15/11, hx of segmental colitis associated with severe diverticulosis; formerly on Asacol, now on Delzicol 1200mg  tid. Please advise. Thanks.

## 2012-07-13 NOTE — Telephone Encounter (Signed)
Informed pt to start Rowasa enemas QHS and call with progress report; pt stated understanding.

## 2012-08-20 ENCOUNTER — Telehealth: Payer: Self-pay | Admitting: *Deleted

## 2012-08-20 NOTE — Telephone Encounter (Signed)
Message copied by Florene Glen on Thu Aug 20, 2012  9:43 AM ------      Message from: Florene Glen      Created: Fri Jun 19, 2012  1:49 PM       Call in [place] to schedule a f/u to discuss colon
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
## 2012-10-06 ENCOUNTER — Telehealth: Payer: Self-pay | Admitting: Gastroenterology

## 2012-10-06 NOTE — Telephone Encounter (Signed)
lmom for pt to call me back. Wanted to know if she has had her gall bladder removed and where her pain was exactly.

## 2012-10-06 NOTE — Telephone Encounter (Signed)
Pt states she's been eating a lot of vanilla yogurt lately and her stools are very light in color; not white, but very light brown. She also has intermittent pain in her upper and lower abdomen, but nothing bad. She has a hx of severe diverticulitis and segmental colitis. She has not had her gall bladder removed and today she's had 4 stools, but she eats a lot of vegetables and greens. Please advise. Thanks.

## 2012-10-23 ENCOUNTER — Other Ambulatory Visit: Payer: Self-pay | Admitting: Internal Medicine

## 2012-10-23 DIAGNOSIS — R911 Solitary pulmonary nodule: Secondary | ICD-10-CM

## 2012-10-28 ENCOUNTER — Other Ambulatory Visit: Payer: Medicare Other

## 2012-10-28 ENCOUNTER — Ambulatory Visit
Admission: RE | Admit: 2012-10-28 | Discharge: 2012-10-28 | Disposition: A | Discharge status: Home / Self Care | Payer: Medicare Other | Source: Ambulatory Visit | Attending: Internal Medicine | Admitting: Internal Medicine

## 2012-10-28 DIAGNOSIS — R911 Solitary pulmonary nodule: Secondary | ICD-10-CM

## 2012-10-29 ENCOUNTER — Telehealth: Payer: Self-pay | Admitting: Gastroenterology

## 2012-10-29 MED ORDER — MESALAMINE 400 MG PO CPDR: 1200.0000 mg | DELAYED_RELEASE_CAPSULE | Freq: Three times a day (TID) | ORAL | Status: DC | PRN

## 2012-10-29 NOTE — Telephone Encounter (Signed)
PATIENT CALLED WANTING SAMPLES OF DELZICOL.  I CALLED PATIENT BACK AND SAID WE CAN LEAVE SAMPLES UP FRONT AND WHEN SHE COMES TODAY TO PICK THE SAMPLES UP SHE HAS TO MAKE AN APPOINTMENT FOR COLONOSCOPY.  PATIENT VERBALIZED UNDERSTANDING. __________________________________________________________________________________________________________________________________________________________________________  PER DR. PATTERSON'S  OFFICE NOTE ON  11-05-11 PATIENT NEEDED COLONOSCOPY IN ONE YEAR.Marland Kitchen  Assessment and Plan: Segmental colitis responded well to increased doses of by mouth aminosialyicate. She complains of excessive gas, and does have lactose intolerance. She is to take Lactaid with her meals and as needed Beano and to avoid other nonabsorbable carbohydrates. I have placed her on a FODMAP diet to try. She will need followup colonoscopy in one years time.  No diagnosis found.

## 2012-11-02 ENCOUNTER — Telehealth: Payer: Self-pay | Admitting: Gastroenterology

## 2012-11-02 NOTE — Telephone Encounter (Signed)
Returned pt's phone call and she stated since she has not heard about her CT she will go ahead and schedule her COLON. I asked how she was feeling and she stated she's better today, but was very fatigued yesterday. Asked if she was having any pain and she stated she has mild pain in the middle of her stomach and in her RUQ. She mentioned the light stools again that she asked me about earlier; see below.  Linna Hoff, RN at 10/06/2012 6:00 PM    Status: Signed             Pt states she's been eating a lot of vanilla yogurt lately and her stools are very light in color; not white, but very light brown. She also has intermittent pain in her upper and lower abdomen, but nothing bad. She has a hx of severe diverticulitis and segmental colitis.  She has not had her gall bladder removed and today she's had 4 stools, but she eats a lot of vegetables and greens.  Please advise. Thanks.       Informed pt her lung nodule looks stable, but we are concerned about the pancreas and that she may have acute pancreatitis again. Pt will see Mike Gip, PA in the am.

## 2012-11-03 ENCOUNTER — Ambulatory Visit (INDEPENDENT_AMBULATORY_CARE_PROVIDER_SITE_OTHER): Payer: Medicare Other | Admitting: Physician Assistant

## 2012-11-03 ENCOUNTER — Encounter: Payer: Self-pay | Admitting: Physician Assistant

## 2012-11-03 ENCOUNTER — Other Ambulatory Visit (INDEPENDENT_AMBULATORY_CARE_PROVIDER_SITE_OTHER): Payer: Medicare Other

## 2012-11-03 ENCOUNTER — Telehealth: Payer: Self-pay | Admitting: Physician Assistant

## 2012-11-03 ENCOUNTER — Other Ambulatory Visit: Payer: Self-pay | Admitting: *Deleted

## 2012-11-03 VITALS — BP 106/70 | HR 100 | Ht 64.25 in | Wt 156.4 lb

## 2012-11-03 DIAGNOSIS — R110 Nausea: Secondary | ICD-10-CM

## 2012-11-03 DIAGNOSIS — R634 Abnormal weight loss: Secondary | ICD-10-CM

## 2012-11-03 DIAGNOSIS — R109 Unspecified abdominal pain: Secondary | ICD-10-CM

## 2012-11-03 DIAGNOSIS — S2220XA Unspecified fracture of sternum, initial encounter for closed fracture: Secondary | ICD-10-CM | POA: Insufficient documentation

## 2012-11-03 DIAGNOSIS — S36229A Contusion of unspecified part of pancreas, initial encounter: Secondary | ICD-10-CM | POA: Insufficient documentation

## 2012-11-03 DIAGNOSIS — S36229D Contusion of unspecified part of pancreas, subsequent encounter: Secondary | ICD-10-CM

## 2012-11-03 DIAGNOSIS — K859 Acute pancreatitis without necrosis or infection, unspecified: Secondary | ICD-10-CM

## 2012-11-03 DIAGNOSIS — Z5189 Encounter for other specified aftercare: Secondary | ICD-10-CM

## 2012-11-03 LAB — COMPREHENSIVE METABOLIC PANEL
AST: 189 U/L — ABNORMAL HIGH (ref 0–37)
Albumin: 4.1 g/dL (ref 3.5–5.2)
BUN: 12 mg/dL (ref 6–23)
Calcium: 9.5 mg/dL (ref 8.4–10.5)
Chloride: 101 mEq/L (ref 96–112)
Glucose, Bld: 130 mg/dL — ABNORMAL HIGH (ref 70–99)
Potassium: 4 mEq/L (ref 3.5–5.1)

## 2012-11-03 LAB — CBC WITH DIFFERENTIAL/PLATELET
Basophils Relative: 0.5 % (ref 0.0–3.0)
Eosinophils Relative: 3.3 % (ref 0.0–5.0)
Lymphocytes Relative: 17.1 % (ref 12.0–46.0)
Neutrophils Relative %: 74 % (ref 43.0–77.0)
RBC: 4.51 Mil/uL (ref 3.87–5.11)
WBC: 6.8 10*3/uL (ref 4.5–10.5)

## 2012-11-03 LAB — LIPASE: Lipase: 74 U/L — ABNORMAL HIGH (ref 11.0–59.0)
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
## 2012-11-03 NOTE — Patient Instructions (Addendum)
Please go to the basement level to have your labs drawn.  We have scheduled the MRi Abdomen and Pelvis at [facility], 1st floor. Date is 4-15 Tues. , arrive at 9:45 AM.  Have nothing by mouth after 7 AM.

## 2012-11-03 NOTE — Telephone Encounter (Signed)
Called Radiology and got the patient an appointment for tomorrow, 11-04-2012 at 4 PM, [facility].  She is to arrive at 3:45 PM.  She should have nothing past 11 AM.  Pt informed of date, time and NPO instructions.

## 2012-11-04 ENCOUNTER — Ambulatory Visit (HOSPITAL_COMMUNITY): Admission: RE | Admit: 2012-11-04 | Payer: Medicare Other | Source: Ambulatory Visit

## 2012-11-04 ENCOUNTER — Ambulatory Visit (HOSPITAL_COMMUNITY)
Admission: RE | Admit: 2012-11-04 | Discharge: 2012-11-04 | Disposition: A | Discharge status: Home / Self Care | Payer: Medicare Other | Source: Ambulatory Visit | Attending: Physician Assistant | Admitting: Physician Assistant

## 2012-11-04 DIAGNOSIS — N72 Inflammatory disease of cervix uteri: Secondary | ICD-10-CM | POA: Insufficient documentation

## 2012-11-04 DIAGNOSIS — K838 Other specified diseases of biliary tract: Secondary | ICD-10-CM | POA: Insufficient documentation

## 2012-11-04 DIAGNOSIS — K8689 Other specified diseases of pancreas: Secondary | ICD-10-CM | POA: Insufficient documentation

## 2012-11-04 DIAGNOSIS — R110 Nausea: Secondary | ICD-10-CM | POA: Insufficient documentation

## 2012-11-04 DIAGNOSIS — R634 Abnormal weight loss: Secondary | ICD-10-CM

## 2012-11-04 DIAGNOSIS — M47817 Spondylosis without myelopathy or radiculopathy, lumbosacral region: Secondary | ICD-10-CM | POA: Insufficient documentation

## 2012-11-04 DIAGNOSIS — R109 Unspecified abdominal pain: Secondary | ICD-10-CM | POA: Insufficient documentation

## 2012-11-04 DIAGNOSIS — K859 Acute pancreatitis without necrosis or infection, unspecified: Secondary | ICD-10-CM

## 2012-11-04 MED ORDER — GADOBENATE DIMEGLUMINE 529 MG/ML IV SOLN
15.0000 mL | Freq: Once | INTRAVENOUS | Status: AC
  Administered 2012-11-04: 15 mL via INTRAVENOUS

## 2012-11-05 ENCOUNTER — Other Ambulatory Visit: Payer: Self-pay | Admitting: *Deleted

## 2012-11-05 ENCOUNTER — Telehealth: Payer: Self-pay | Admitting: Physician Assistant

## 2012-11-05 DIAGNOSIS — R945 Abnormal results of liver function studies: Secondary | ICD-10-CM

## 2012-11-05 DIAGNOSIS — R634 Abnormal weight loss: Secondary | ICD-10-CM

## 2012-11-05 DIAGNOSIS — R932 Abnormal findings on diagnostic imaging of liver and biliary tract: Secondary | ICD-10-CM

## 2012-11-05 DIAGNOSIS — K8689 Other specified diseases of pancreas: Secondary | ICD-10-CM

## 2012-11-05 NOTE — Telephone Encounter (Signed)
Per Mike Gip, PA, patient also needs CA19-9 drawn next week.

## 2012-11-05 NOTE — Telephone Encounter (Signed)
Lab in Epic. Will call patient next week.

## 2012-11-05 NOTE — Telephone Encounter (Signed)
Spoke with [place] at [facility]. The earliest appointment is 01/19/13 at 1:45 PM with Dr. Lanell Matar. Faxed records to [phone]. Mike Gip, PA aware of appointment date and will discuss with Dr. Jarold Motto.

## 2012-11-05 NOTE — Telephone Encounter (Signed)
Patient given results and recommendations. 
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
## 2012-11-05 NOTE — Telephone Encounter (Signed)
Spoke with patient and she is leaving town today and she is asking for results of MRI prior to leaving. Please, advise.

## 2012-11-06 NOTE — Telephone Encounter (Signed)
Was able to get EUS scheduled for Wednesday, 11/11/12 , at 12:30pm. She is in [place], on her way to [place], she is worried d/t nausea and she threw up last pm. She has pain that is concerning her as well. States the pain is in her back above her wait, but then it radiates down to her waist to the front at her right quadrant. She has Tramadol she can take for pain; she took one the other day and it helped the pain. Spoke with Dr Jarold Motto and when pt gives me the name of a pharmacy, I can order Zofran. Marchelle Folks will mail the pt info on the visit and fax instructions to me in case she doesn't get it.

## 2012-11-06 NOTE — Telephone Encounter (Signed)
Marchelle Folks called back and pt can be done next week; she can do her on Monday, 02/08/13. Tried to call the pt and left a message on [employer] phones

## 2012-11-06 NOTE — Telephone Encounter (Signed)
Per Dr Jarold Motto, draw [phone] and get a GI procedure Doc at [facility] for a peer to peer in order to move the EUS up. Spoke with procedure Scheduling at [place], [id number] and an RN , Marchelle Folks, looked at our faxed notes and she will have someone review them and hopefully call me this am with a procedure appt.

## 2012-11-06 NOTE — Telephone Encounter (Signed)
Called to have disk made to take to [facility]; pt to pick up.

## 2012-11-10 ENCOUNTER — Ambulatory Visit (HOSPITAL_COMMUNITY): Payer: Medicare Other

## 2012-11-10 ENCOUNTER — Other Ambulatory Visit: Payer: Medicare Other

## 2012-11-10 DIAGNOSIS — K8689 Other specified diseases of pancreas: Secondary | ICD-10-CM

## 2012-11-10 DIAGNOSIS — R945 Abnormal results of liver function studies: Secondary | ICD-10-CM

## 2012-11-10 NOTE — Telephone Encounter (Signed)
Pt did receive her instructions for EUS and we went over the prep and she stated understanding. We dicussed her MRI again and per her request, faxed her a copy. She will pick up the disk of her MRI and CT scans and come here for a Baseline CA19 9 tumor marker lab. Pt will call for problems.

## 2012-11-11 LAB — CANCER ANTIGEN 19-9: CA 19-9: 1003.4 U/mL — ABNORMAL HIGH (ref <35.0)

## 2012-11-16 ENCOUNTER — Encounter: Payer: Self-pay | Admitting: Physician Assistant

## 2012-11-18 ENCOUNTER — Telehealth: Payer: Self-pay | Admitting: Gastroenterology

## 2012-11-18 NOTE — Telephone Encounter (Signed)
rec'd from [facility] forward 8 pages to Dr.Patterson 11/18/12

## 2012-11-20 ENCOUNTER — Telehealth: Payer: Self-pay | Admitting: *Deleted

## 2012-11-20 NOTE — Telephone Encounter (Signed)
Phoned pt to schedule an appt with Mike Gip, PA on Monday to discuss her path and EUS results. She reports Dr Logan Bores, the GI doc who performed the EUS called yesterday. He reports the path showed a lot on inflammation and was vague as to whether there is cancer in the pancreas. He is going to sent the slides to [facility] for hopefully, a better dx. She reports he will send the info to Dr Jarold Motto. Dr Logan Bores wants to refer her to an Oncologist at [facility], but she wants to hear from [place] 1st.  Scheduled her to see Amy on Monday, 11/23/12. Pt is concerned about her blocked duct and her change in stools. She reports her stools are stringy and at the tip of the end is a little bit of BRB; some on tissue, none in toilet bowl. Her appetite is back and she's eating plenty of veggies so she doesn't understand the change in stools except that her duct is blocked. Explained I don't know why the duct could change her stool shape, but Amy can explain on her visit. Encouraged her to try the Miralax BID and call if she hears anything from [facility]. She still has an appt in June with Dr Lanell Matar at [facility]

## 2012-11-23 ENCOUNTER — Ambulatory Visit (INDEPENDENT_AMBULATORY_CARE_PROVIDER_SITE_OTHER): Payer: Medicare Other | Admitting: Physician Assistant

## 2012-11-23 ENCOUNTER — Encounter: Payer: Self-pay | Admitting: Physician Assistant

## 2012-11-23 VITALS — BP 144/72 | HR 74 | Ht 65.0 in | Wt 157.0 lb

## 2012-11-23 DIAGNOSIS — K869 Disease of pancreas, unspecified: Secondary | ICD-10-CM

## 2012-11-23 DIAGNOSIS — K8689 Other specified diseases of pancreas: Secondary | ICD-10-CM

## 2012-11-23 DIAGNOSIS — K5010 Crohn's disease of large intestine without complications: Secondary | ICD-10-CM

## 2012-11-23 MED ORDER — HYDROCODONE-ACETAMINOPHEN 5-300 MG PO TABS: 1.0000 | ORAL_TABLET | ORAL | Status: DC

## 2012-11-23 NOTE — Patient Instructions (Addendum)
We will fax the prescription for Vicodin 5/300 mg to [facility] [address]. Take Gas-X with meals.  Dr Norval Gable office will be in touch with you regarding results from [facility].
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
## 2012-11-25 ENCOUNTER — Other Ambulatory Visit: Payer: Self-pay | Admitting: *Deleted

## 2012-11-25 NOTE — Progress Notes (Signed)
Phoned pt to ask if she has heard from Dr Logan Bores at [facility]. Pt reports the Oncology Dept called to schedule an appt, but she informed them she will not schedule an appt until the final path comes from [facility]. She asked that I leave her copies of MRI, CT scan and lab at the front desk. Done.

## 2012-12-07 ENCOUNTER — Telehealth: Payer: Self-pay | Admitting: *Deleted

## 2012-12-07 NOTE — Telephone Encounter (Signed)
Pt called last week and left a message and I was on vacation. Pt reports she saw Dr Chestine Spore, Oncologist at [facility], last week at [facility]. The path from [facility] was inconclusive; Dr Chestine Spore stated there are 2 masses in the pancreas and another bx may need to be done. He will repeat the CT scan on 12/09/12 with contrast and she will be pre medicated d/t an old allergy to IVP dye. If we received the path report, pt would like a copy of it. Explained to pt to keep [place] informed and I will send this to Mike Gip, PA and Dr Jarold Motto to keep them informed; pt stated understanding.

## 2012-12-07 NOTE — Telephone Encounter (Signed)
OK- thanks,Please let pt know  She should follow thru with all recommendations , and that I am thinking about her -she is in the right place to get the best care.

## 2012-12-07 NOTE — Telephone Encounter (Signed)
lmom for pt informing her of Amy's recommendations/note. Pt may call back for questions.

## 2012-12-11 ENCOUNTER — Telehealth: Payer: Self-pay | Admitting: *Deleted

## 2012-12-11 MED ORDER — PANCRELIPASE (LIP-PROT-AMYL) 36000-114000 UNITS PO CPEP: 2.0000 | ORAL_CAPSULE | Freq: Three times a day (TID) | ORAL | Status: DC
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
## 2012-12-17 ENCOUNTER — Other Ambulatory Visit: Payer: Self-pay | Admitting: *Deleted

## 2012-12-17 MED ORDER — PANCRELIPASE (LIP-PROT-AMYL) 24000-76000 UNITS PO CPEP: ORAL_CAPSULE | ORAL | Status: DC

## 2013-01-07 ENCOUNTER — Telehealth: Payer: Self-pay | Admitting: *Deleted

## 2013-01-07 NOTE — Telephone Encounter (Signed)
Pt reports her MRI was not paid for by [facility] and she states she's had numerous procedures and they've always been pain for. Please advise. Thanks.

## 2013-01-19 ENCOUNTER — Telehealth: Payer: Self-pay | Admitting: *Deleted

## 2013-01-19 NOTE — Telephone Encounter (Signed)
lmom for pt to begin the Delzicol again; she may call back for questions

## 2013-01-19 NOTE — Telephone Encounter (Signed)
I would continue this !!!!

## 2013-01-19 NOTE — Telephone Encounter (Signed)
Pt reports she has received her 1st chemo infusion of Gemzar at [name]. She states she has been following a hi fiber diet and is having no problems with her bowels and abdominal pain. She was on Delzicol, but hasn't taken it lately. OK if she holds the Delzicol for now ? Thanks.

## 2013-01-21 ENCOUNTER — Other Ambulatory Visit: Payer: Self-pay | Admitting: *Deleted

## 2013-01-21 MED ORDER — PANCRELIPASE (LIP-PROT-AMYL) 24000-76000 UNITS PO CPEP: ORAL_CAPSULE | ORAL | Status: DC

## 2013-01-21 MED ORDER — MESALAMINE 400 MG PO CPDR: 1200.0000 mg | DELAYED_RELEASE_CAPSULE | Freq: Three times a day (TID) | ORAL | Status: DC | PRN

## 2013-01-21 MED ORDER — ESOMEPRAZOLE MAGNESIUM 40 MG PO CPDR: 40.0000 mg | DELAYED_RELEASE_CAPSULE | Freq: Every day | ORAL | Status: DC

## 2013-02-12 ENCOUNTER — Other Ambulatory Visit: Payer: Self-pay | Admitting: *Deleted

## 2013-02-12 MED ORDER — PANCRELIPASE (LIP-PROT-AMYL) 24000-76000 UNITS PO CPEP: ORAL_CAPSULE | ORAL | Status: DC

## 2013-02-15 NOTE — Telephone Encounter (Signed)
More info was resubmitted for the claim.

## 2013-03-03 ENCOUNTER — Other Ambulatory Visit: Payer: Self-pay

## 2013-04-02 ENCOUNTER — Other Ambulatory Visit: Payer: Self-pay

## 2013-04-02 DIAGNOSIS — Z1231 Encounter for screening mammogram for malignant neoplasm of breast: Secondary | ICD-10-CM

## 2013-04-20 ENCOUNTER — Other Ambulatory Visit: Payer: Self-pay | Admitting: *Deleted

## 2013-04-20 MED ORDER — MESALAMINE 400 MG PO CPDR: 1200.0000 mg | DELAYED_RELEASE_CAPSULE | Freq: Three times a day (TID) | ORAL | Status: DC | PRN

## 2013-04-20 MED ORDER — PANCRELIPASE (LIP-PROT-AMYL) 36000-114000 UNITS PO CPEP: 2.0000 | ORAL_CAPSULE | Freq: Three times a day (TID) | ORAL | Status: DC

## 2013-04-21 ENCOUNTER — Ambulatory Visit
Admission: RE | Admit: 2013-04-21 | Discharge: 2013-04-21 | Disposition: A | Discharge status: Home / Self Care | Payer: PRIVATE HEALTH INSURANCE | Source: Ambulatory Visit

## 2013-04-21 ENCOUNTER — Other Ambulatory Visit: Payer: Self-pay | Admitting: *Deleted

## 2013-04-21 DIAGNOSIS — Z1231 Encounter for screening mammogram for malignant neoplasm of breast: Secondary | ICD-10-CM

## 2013-04-21 MED ORDER — ESOMEPRAZOLE MAGNESIUM 40 MG PO CPDR: 40.0000 mg | DELAYED_RELEASE_CAPSULE | Freq: Every day | ORAL | Status: AC

## 2013-06-03 ENCOUNTER — Other Ambulatory Visit: Payer: Self-pay

## 2013-06-22 ENCOUNTER — Other Ambulatory Visit: Payer: Self-pay | Admitting: *Deleted

## 2013-06-22 MED ORDER — PANCRELIPASE (LIP-PROT-AMYL) 36000-114000 UNITS PO CPEP: 2.0000 | ORAL_CAPSULE | Freq: Three times a day (TID) | ORAL | Status: DC

## 2013-07-05 ENCOUNTER — Telehealth: Payer: Self-pay | Admitting: Gastroenterology

## 2013-07-05 NOTE — Telephone Encounter (Signed)
Pt just wanted to talk of her tx for pancreatic cancer at [facility]. She will fax some of the scans for Dr Jarold Motto to review.

## 2013-08-10 ENCOUNTER — Telehealth: Payer: Self-pay | Admitting: Gastroenterology

## 2013-08-10 NOTE — Telephone Encounter (Signed)
Spoke with pt about her upcoming tests.

## 2013-09-20 LAB — GLUCOSE, POCT (MANUAL RESULT ENTRY): POC Glucose: 109 mg/dl — AB (ref 70–99)

## 2013-09-28 ENCOUNTER — Telehealth: Payer: Self-pay | Admitting: Gastroenterology

## 2013-09-28 NOTE — Telephone Encounter (Signed)
Patient is asking for Creon samples. Will call her tomorrow if samples are available.

## 2013-09-28 NOTE — Telephone Encounter (Signed)
Unable to reach patient. Will try again later. 

## 2013-09-29 MED ORDER — PANCRELIPASE (LIP-PROT-AMYL) 36000-114000 UNITS PO CPEP: 2.0000 | ORAL_CAPSULE | Freq: Three times a day (TID) | ORAL | Status: AC

## 2013-09-29 NOTE — Telephone Encounter (Signed)
Spoke with patient and told her I will have samples up front for her. Patient will call back to schedule OV to establish with new GI MD.

## 2013-11-10 ENCOUNTER — Other Ambulatory Visit: Payer: Self-pay | Admitting: Gastroenterology

## 2013-11-10 DIAGNOSIS — C259 Malignant neoplasm of pancreas, unspecified: Secondary | ICD-10-CM

## 2013-11-23 ENCOUNTER — Ambulatory Visit (HOSPITAL_BASED_OUTPATIENT_CLINIC_OR_DEPARTMENT_OTHER): Payer: Medicare Other | Admitting: Nurse Practitioner

## 2013-11-23 ENCOUNTER — Other Ambulatory Visit: Payer: Self-pay | Admitting: *Deleted

## 2013-11-23 ENCOUNTER — Encounter: Payer: Self-pay | Admitting: Oncology

## 2013-11-23 ENCOUNTER — Encounter: Payer: Self-pay | Admitting: Nurse Practitioner

## 2013-11-23 ENCOUNTER — Other Ambulatory Visit: Payer: Self-pay | Admitting: Nurse Practitioner

## 2013-11-23 ENCOUNTER — Ambulatory Visit: Payer: Medicare Other

## 2013-11-23 ENCOUNTER — Telehealth: Payer: Self-pay | Admitting: Oncology

## 2013-11-23 VITALS — BP 131/73 | HR 85 | Temp 97.9°F | Resp 18 | Ht 65.0 in | Wt 118.4 lb

## 2013-11-23 DIAGNOSIS — E039 Hypothyroidism, unspecified: Secondary | ICD-10-CM

## 2013-11-23 DIAGNOSIS — I10 Essential (primary) hypertension: Secondary | ICD-10-CM

## 2013-11-23 DIAGNOSIS — G893 Neoplasm related pain (acute) (chronic): Secondary | ICD-10-CM

## 2013-11-23 DIAGNOSIS — C259 Malignant neoplasm of pancreas, unspecified: Secondary | ICD-10-CM | POA: Insufficient documentation

## 2013-11-23 NOTE — Telephone Encounter (Signed)
gv adn pritned appts for May
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
## 2013-11-23 NOTE — Progress Notes (Signed)
Checked in new pt with no financial concerns. °

## 2013-11-25 ENCOUNTER — Other Ambulatory Visit: Payer: Self-pay | Admitting: Nurse Practitioner

## 2013-11-25 ENCOUNTER — Telehealth: Payer: Self-pay | Admitting: *Deleted

## 2013-11-25 DIAGNOSIS — C259 Malignant neoplasm of pancreas, unspecified: Secondary | ICD-10-CM

## 2013-11-25 NOTE — Telephone Encounter (Signed)
Received phone call from patient requesting Dr. Benay Spice to schedule her for a paracentesis, as discussed on 11/23/13.  She stated she is uncomfortable and wishes to have this done.  She also stated she is still deciding on whether or not she wants to take further chemotherapy.  She stated she will contact Dr. Benay Spice if she makes a decision on chemotherapy prior to next MD visit scheduled for 12/08/13.  She understands that she will be contacted by a scheduler for date and time of paracentesis.  Dr. Benay Spice was given above information.

## 2013-11-26 ENCOUNTER — Ambulatory Visit (HOSPITAL_COMMUNITY)
Admission: RE | Admit: 2013-11-26 | Discharge: 2013-11-26 | Disposition: A | Discharge status: Home / Self Care | Payer: Medicare Other | Source: Ambulatory Visit | Attending: Nurse Practitioner | Admitting: Nurse Practitioner

## 2013-11-26 ENCOUNTER — Other Ambulatory Visit: Payer: Self-pay | Admitting: Nurse Practitioner

## 2013-11-26 ENCOUNTER — Telehealth: Payer: Self-pay | Admitting: *Deleted

## 2013-11-26 DIAGNOSIS — C801 Malignant (primary) neoplasm, unspecified: Secondary | ICD-10-CM | POA: Insufficient documentation

## 2013-11-26 DIAGNOSIS — C259 Malignant neoplasm of pancreas, unspecified: Secondary | ICD-10-CM

## 2013-11-26 NOTE — Telephone Encounter (Signed)
Patient called reporting today's [place] did not show any fluid so she did not need to have the paracentesis procedure done.  Next f/u on 12-08-2013.  Will notify providers.

## 2013-11-26 NOTE — Progress Notes (Signed)
Patient presented today for an ultrasound guided paracentesis. Upon reviewing ultrasound images, no ascites is seen. Procedure was cancelled.  Tsosie Billing PA-C Interventional Radiology  11/26/13  11:27 AM

## 2013-11-29 ENCOUNTER — Telehealth: Payer: Self-pay | Admitting: *Deleted

## 2013-11-29 NOTE — Telephone Encounter (Signed)
Received call from pt stating "my hydromorphone is not working [employer] and spoke with pt and she reports that for the last 2 days her pain in her lower back/abdomin/rectum was a "7" on pain scale of 1-10.  When asked what it is now; reports "it's 2-3 now because I have been putting a heating pad on it today and it's much better"  Pt states she still thinks she needs a stronger pain med; takes hydromorphone twice or three times a day; asks "I've never taken the hydrocodone, should I take that, please ask Dr. Benay Spice what to do"  Note to MD.

## 2013-11-29 NOTE — Telephone Encounter (Signed)
Per Dr. Benay Spice; notified pt that MD said she can take increase to 2 tablets of the Hydromorphone every 4 hours as needed for pain control.  Pt verbalized understanding and confirmed appt for 12/08/13.

## 2013-12-08 ENCOUNTER — Encounter: Payer: Self-pay | Admitting: Oncology

## 2013-12-08 ENCOUNTER — Telehealth: Payer: Self-pay | Admitting: Oncology

## 2013-12-08 ENCOUNTER — Ambulatory Visit (HOSPITAL_BASED_OUTPATIENT_CLINIC_OR_DEPARTMENT_OTHER): Payer: Medicare Other | Admitting: Oncology

## 2013-12-08 ENCOUNTER — Encounter: Payer: Self-pay | Admitting: *Deleted

## 2013-12-08 VITALS — BP 143/75 | HR 79 | Temp 97.7°F | Resp 18 | Ht 65.0 in | Wt 113.0 lb

## 2013-12-08 DIAGNOSIS — C251 Malignant neoplasm of body of pancreas: Secondary | ICD-10-CM

## 2013-12-08 DIAGNOSIS — R634 Abnormal weight loss: Secondary | ICD-10-CM

## 2013-12-08 DIAGNOSIS — C259 Malignant neoplasm of pancreas, unspecified: Secondary | ICD-10-CM

## 2013-12-08 DIAGNOSIS — C250 Malignant neoplasm of head of pancreas: Secondary | ICD-10-CM

## 2013-12-08 DIAGNOSIS — M549 Dorsalgia, unspecified: Secondary | ICD-10-CM

## 2013-12-08 DIAGNOSIS — I10 Essential (primary) hypertension: Secondary | ICD-10-CM

## 2013-12-08 DIAGNOSIS — R188 Other ascites: Secondary | ICD-10-CM

## 2013-12-08 DIAGNOSIS — R109 Unspecified abdominal pain: Secondary | ICD-10-CM

## 2013-12-08 DIAGNOSIS — C787 Secondary malignant neoplasm of liver and intrahepatic bile duct: Secondary | ICD-10-CM

## 2013-12-08 MED ORDER — MEGESTROL ACETATE 40 MG/ML PO SUSP: 200.0000 mg | Freq: Two times a day (BID) | ORAL | Status: DC

## 2013-12-08 MED ORDER — HYDROMORPHONE HCL 2 MG PO TABS: 2.0000 mg | ORAL_TABLET | ORAL | Status: DC | PRN

## 2013-12-08 MED ORDER — HYDROMORPHONE HCL 2 MG PO TABS: 1.0000 mg | ORAL_TABLET | ORAL | Status: DC | PRN

## 2013-12-08 NOTE — Telephone Encounter (Signed)
s/w pt re appts for 5/21 and 6/11. s/w for 6/11 f/u per 5/13 pof. instruction for flush 5/21 instead of 6/11 per 2nd 5/13 pof. nutr appt added per 3rd 5/21 pof.

## 2013-12-08 NOTE — Progress Notes (Signed)
  [facility] OFFICE PROGRESS NOTE   Diagnosis: Pancreas cancer  INTERVAL HISTORY:   She returns as scheduled. She reports intermittent pain at the right iliac region. No tenderness. The pain is relieved with hydrocodone. She reports taking hydrocodone approximately twice daily. She has frequent bowel movements, but no diarrhea. She wonders whether the pain is coming from her "colon ".  Brittney [name] has decided against salvage chemotherapy. She complains of anorexia   she was referred for a paracentesis 11/26/2013 and no ascites was found on ultrasound.  Objective:  Vital signs in last 24 hours:  Blood pressure 143/75, pulse 79, temperature 97.7 F (36.5 C), temperature source Oral, resp. rate 18, height 5\' 5"  (1.651 m), weight 113 lb (51.256 kg), SpO2 100.00%.    Resp: Lungs clear bilaterally Cardio: Regular rate and rhythm GI: No hepatomegaly nontender, no mass, mildly distended Vascular: No leg edema Musculoskeletal: The area of discomfort as at the upper lateral iliac. No mass or tenderness. Full range of motion at the right hip without pain  Port-A-Cath without erythema Medications: I have reviewed the patient's current medications.  Assessment/Plan: 1. Pancreas cancer, locally advanced, diagnosed April 2014. Baseline CA 19-9 on 11/10/2012 elevated at 1003.  Gemcitabine/Abraxane initiated beginning 01/12/2013.  CA 19-9 improved.  Restaging MRI of the abdomen 03/09/2013 showed progressive superior mesenteric vein involvement.  Gemcitabine/Abraxane continued for an additional cycle.  Radiation and concurrent Xeloda chemotherapy beginning 04/27/2013 for 28 fractions.  Restaging CT scan 07/01/2013 with findings concerning for local progression.  Treatment break through December 2014.  Hospitalization with hyperbilirubinemia January 2015. Obstruction of the distal common bile duct. Percutaneous biliary drain placed on 08/05/2013. Percutaneous bile duct stent placed  on 09/20/2013.  Treatment resumed with gemcitabine and Abraxane on 08/19/2012.  Restaging CT evaluation 10/20/2013 showed a new liver lesion concerning for hepatic metastasis; increasing abdominal ascites. 2. January 2015 hyperbilirubinemia due to obstruction of the distal common bile duct. Percutaneous biliary drain placed 08/05/2013. Percutaneous bile duct stent placed on 09/20/2013. 3. Port-A-Cath placement 03/12/2013. 4. Weight loss. She has lost 40 pounds over the past year. 5. Abdominal/back pain secondary to #1. She takes Dilaudid as needed. 6. History of hypertension. 7. Hypothyroid on replacement.   Disposition:  Brittney [name] has metastatic pancreas cancer. She does not wish to receive further chemotherapy. The plan is to proceed with a comfort care approach. She will continue hydromorphone as needed for pain. We will add a long acting narcotic if she requires frequent pain medication. She will begin a trial of Megace as an appetite stimulant. We discussed the increased risk of thromboembolic disease with Megace.  Brittney [name] will return for a Port-A-Cath flush in 2 weeks. She will be scheduled for an office visit in 4 weeks.  I will review the [facility] CT to look for an explanation for her pain. We will consider obtaining a PET scan if the iliac pain persists.  Ladell Pier, MD  12/08/2013  10:32 AM

## 2013-12-08 NOTE — Progress Notes (Signed)
Faxed megestrol pa form to [facility] °

## 2013-12-08 NOTE — Progress Notes (Signed)
Received faxed prior authorization request for Megestrol suspension from [facility]. Forwarded to managed care.

## 2013-12-09 ENCOUNTER — Telehealth: Payer: Self-pay | Admitting: *Deleted

## 2013-12-09 ENCOUNTER — Encounter: Payer: Self-pay | Admitting: Oncology

## 2013-12-09 ENCOUNTER — Telehealth: Payer: Self-pay | Admitting: Internal Medicine

## 2013-12-09 NOTE — Telephone Encounter (Signed)
Left message for patient to call back  

## 2013-12-09 NOTE — Telephone Encounter (Signed)
Call from pt with questions about appetite stimulant. Pharmacy had not contacted her yet. Discussed Megace with pt, she also asked about Marinol. Told pt this may be an option if she does not get results with Megace. Instructed her to contact pharmacy to follow up on Megace Rx.

## 2013-12-09 NOTE — Telephone Encounter (Signed)
Patient notified that no earlier appts at this time

## 2013-12-09 NOTE — Progress Notes (Signed)
Called [facility] [id number] for megace acetate expedited appeal.

## 2013-12-10 ENCOUNTER — Telehealth: Payer: Self-pay | Admitting: Oncology

## 2013-12-10 NOTE — Telephone Encounter (Signed)
per Dr. Benay Spice move 6.11 to LT

## 2013-12-14 ENCOUNTER — Encounter: Payer: Self-pay | Admitting: Internal Medicine

## 2013-12-15 ENCOUNTER — Ambulatory Visit (INDEPENDENT_AMBULATORY_CARE_PROVIDER_SITE_OTHER): Payer: Medicare Other | Admitting: Internal Medicine

## 2013-12-15 ENCOUNTER — Encounter: Payer: Self-pay | Admitting: Internal Medicine

## 2013-12-15 VITALS — BP 110/68 | HR 82 | Ht 63.5 in | Wt 114.0 lb

## 2013-12-15 DIAGNOSIS — C259 Malignant neoplasm of pancreas, unspecified: Secondary | ICD-10-CM

## 2013-12-15 DIAGNOSIS — M533 Sacrococcygeal disorders, not elsewhere classified: Secondary | ICD-10-CM

## 2013-12-15 DIAGNOSIS — K5010 Crohn's disease of large intestine without complications: Secondary | ICD-10-CM

## 2013-12-15 MED ORDER — DIPHENOXYLATE-ATROPINE 2.5-0.025 MG PO TABS: 1.0000 | ORAL_TABLET | Freq: Four times a day (QID) | ORAL | Status: AC | PRN

## 2013-12-15 NOTE — Patient Instructions (Signed)
We have sent the following medications to your pharmacy for you to pick up at your convenience: Lomotil; take as directed  You can purchase imodium over the counter and take as needed
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
## 2013-12-16 ENCOUNTER — Encounter: Payer: Medicare Other | Admitting: Nutrition

## 2013-12-16 ENCOUNTER — Encounter: Payer: Self-pay | Admitting: Nutrition

## 2013-12-16 NOTE — Progress Notes (Signed)
Patient cancelled nutrition appointment.  She did not feel like she needed any more information at this time.

## 2013-12-21 ENCOUNTER — Telehealth: Payer: Self-pay | Admitting: *Deleted

## 2013-12-21 ENCOUNTER — Telehealth: Payer: Self-pay | Admitting: Internal Medicine

## 2013-12-21 NOTE — Telephone Encounter (Signed)
I also see that she stopped her Megace overconcern for diarrhea Would continue 4 times daily Lomotil as directed. She also can use Imodium in addition to Lomotil per box instruction Dr. Benay Spice was going to review imaging and advise on which imaging study to proceed with to evaluate her sacral pain If she has not heard about an imaging study to evaluate this pain she should contact their office in this regard

## 2013-12-21 NOTE — Telephone Encounter (Signed)
I spoke with the patient and she reports despite qid lomotil and creon she is still having multiple liquid stools a day.  What is the next step?

## 2013-12-21 NOTE — Telephone Encounter (Signed)
Patient notified. She will contact Dr. Gearldine Shown office tomorrow if she has not heard from them by mid-day tomorrow

## 2013-12-21 NOTE — Telephone Encounter (Signed)
Message from pt reporting she discontinued Megace due to diarrhea. Taking Lomotil QID. Returned call, no answer.

## 2013-12-22 ENCOUNTER — Telehealth: Payer: Self-pay | Admitting: Internal Medicine

## 2013-12-22 NOTE — Telephone Encounter (Signed)
Patient reports diarrhea since yesterday.  She is also having left sided abdominal pain not relieved by Dilaudid x 2.  She reports she is "washed out".  She will come in and see Nicoletta Ba PA tomorrow at 8:30.  She is encouraged to also contact Dr. Gearldine Shown office.  She states she may need to go to the ER if her pain is not relieved.  She will contact me if she goes to the ER.

## 2013-12-22 NOTE — Telephone Encounter (Signed)
Message from pt stating diarrhea persists. Reviewed with Dr. Benay Spice: Instructed her to continue Lomotil, take Imodium up to 8 pills/ day. Push fluids. She voiced understanding. Pt reports 8-10 stools/ day. Reports fatigue and listlessness. Pt reports she is taking #2 Dilaudid tabs since yesterday. L side abdominal pain is somewhat better today. Instructed her to call office if she needs stronger pain med.  She voiced understanding, stated she will see GI tomorrow AM. Pt stated she read online about C-Diff, will ask to be tested for this at GI office tomorrow.

## 2013-12-23 ENCOUNTER — Ambulatory Visit (HOSPITAL_COMMUNITY)
Admission: RE | Admit: 2013-12-23 | Discharge: 2013-12-23 | Disposition: A | Discharge status: Home / Self Care | Payer: Medicare Other | Source: Ambulatory Visit

## 2013-12-23 ENCOUNTER — Ambulatory Visit (INDEPENDENT_AMBULATORY_CARE_PROVIDER_SITE_OTHER): Payer: Medicare Other | Admitting: Physician Assistant

## 2013-12-23 ENCOUNTER — Telehealth: Payer: Self-pay | Admitting: Internal Medicine

## 2013-12-23 ENCOUNTER — Other Ambulatory Visit (INDEPENDENT_AMBULATORY_CARE_PROVIDER_SITE_OTHER): Payer: Medicare Other

## 2013-12-23 ENCOUNTER — Ambulatory Visit (HOSPITAL_COMMUNITY)
Admission: RE | Admit: 2013-12-23 | Discharge: 2013-12-23 | Disposition: A | Discharge status: Home / Self Care | Payer: Medicare Other | Source: Ambulatory Visit | Attending: Physician Assistant | Admitting: Physician Assistant

## 2013-12-23 ENCOUNTER — Encounter: Payer: Self-pay | Admitting: Physician Assistant

## 2013-12-23 VITALS — BP 120/72 | HR 82 | Ht 63.5 in | Wt 111.0 lb

## 2013-12-23 DIAGNOSIS — R1012 Left upper quadrant pain: Secondary | ICD-10-CM

## 2013-12-23 DIAGNOSIS — C259 Malignant neoplasm of pancreas, unspecified: Secondary | ICD-10-CM | POA: Insufficient documentation

## 2013-12-23 DIAGNOSIS — M549 Dorsalgia, unspecified: Secondary | ICD-10-CM | POA: Insufficient documentation

## 2013-12-23 DIAGNOSIS — M546 Pain in thoracic spine: Secondary | ICD-10-CM

## 2013-12-23 DIAGNOSIS — K7689 Other specified diseases of liver: Secondary | ICD-10-CM | POA: Insufficient documentation

## 2013-12-23 DIAGNOSIS — R188 Other ascites: Secondary | ICD-10-CM | POA: Insufficient documentation

## 2013-12-23 DIAGNOSIS — K55059 Acute (reversible) ischemia of intestine, part and extent unspecified: Secondary | ICD-10-CM | POA: Insufficient documentation

## 2013-12-23 DIAGNOSIS — C787 Secondary malignant neoplasm of liver and intrahepatic bile duct: Secondary | ICD-10-CM | POA: Insufficient documentation

## 2013-12-23 LAB — CBC WITH DIFFERENTIAL/PLATELET
Basophils Absolute: 0.1 10*3/uL (ref 0.0–0.1)
Basophils Relative: 1.1 % (ref 0.0–3.0)
Eosinophils Absolute: 0 10*3/uL (ref 0.0–0.7)
Eosinophils Relative: 0.7 % (ref 0.0–5.0)
HCT: 32.7 % — ABNORMAL LOW (ref 36.0–46.0)
HEMOGLOBIN: 10.9 g/dL — AB (ref 12.0–15.0)
Lymphocytes Relative: 10.4 % — ABNORMAL LOW (ref 12.0–46.0)
Lymphs Abs: 0.6 10*3/uL — ABNORMAL LOW (ref 0.7–4.0)
MCHC: 33.4 g/dL (ref 30.0–36.0)
MCV: 87.8 fl (ref 78.0–100.0)
MONOS PCT: 9.2 % (ref 3.0–12.0)
Monocytes Absolute: 0.6 10*3/uL (ref 0.1–1.0)
NEUTROS PCT: 78.6 % — AB (ref 43.0–77.0)
Neutro Abs: 4.9 10*3/uL (ref 1.4–7.7)
PLATELETS: 370 10*3/uL (ref 150.0–400.0)
RBC: 3.72 Mil/uL — ABNORMAL LOW (ref 3.87–5.11)
RDW: 16.5 % — ABNORMAL HIGH (ref 11.5–15.5)
WBC: 6.2 10*3/uL (ref 4.0–10.5)

## 2013-12-23 LAB — COMPREHENSIVE METABOLIC PANEL
ALT: 21 U/L (ref 0–35)
AST: 29 U/L (ref 0–37)
Albumin: 3.4 g/dL — ABNORMAL LOW (ref 3.5–5.2)
Alkaline Phosphatase: 69 U/L (ref 39–117)
BUN: 6 mg/dL (ref 6–23)
CALCIUM: 8.8 mg/dL (ref 8.4–10.5)
CHLORIDE: 98 meq/L (ref 96–112)
CO2: 24 meq/L (ref 19–32)
CREATININE: 0.7 mg/dL (ref 0.4–1.2)
GFR: 103.55 mL/min (ref >60.00)
Glucose, Bld: 97 mg/dL (ref 70–99)
POTASSIUM: 3.7 meq/L (ref 3.5–5.1)
SODIUM: 132 meq/L — AB (ref 135–145)
TOTAL PROTEIN: 7.6 g/dL (ref 6.0–8.3)
Total Bilirubin: 0.6 mg/dL (ref 0.2–1.2)

## 2013-12-23 MED ORDER — PREDNISONE 50 MG PO TABS: ORAL_TABLET | ORAL | Status: DC

## 2013-12-23 NOTE — Telephone Encounter (Signed)
Message copied by Jerene Bears on Thu Dec 23, 2013 12:07 PM ------      Message from: [name] B      Created: Wed Dec 15, 2013  6:43 PM       I have the [facility] CT on a disk and will review 5/21, if there is no bone lesion seen I favor a PET.  I will let you know and we can order.            Thanks      ----- Message -----         From: Jerene Bears, MD         Sent: 12/15/2013  11:50 AM           To: Ladell Pier, MD            Leroy Sea,       I saw Mrs. Pickerill today for the 1st time.  What she is calling rectal pain seems to be sacral pain as you also described when you recently saw her.  There is a definitely palpable and visual difference over the painful location.      I think imaging is the most likely to provide further information for her.      I wanted your opinion regarding pelvic MRI vs. PET scan?      Thanks            Ulice Dash        ------

## 2013-12-23 NOTE — Patient Instructions (Signed)
Please go to the basement level to have your labs drawn and stool study. Take 1 tab of Prednisone 50 mg at 7:30 am tonight 5-28 Take 1 tab of Prednisone 50 mg at 1:30 am  During the night at 5-29 Take 1 tab of Prednisone 50 mg and 2 tab of Benedryl 25 mg at 7:30 am Fri 5-29.    You have been scheduled for a CT scan of the abdomen and pelvis at [place] ([address]---this is in the same building as Press photographer).   You are scheduled on Friday 5-29-2015t 8:30 am . You should arrive at 8:00 am prior to your appointment time for registration. Please follow the written instructions below on the day of your exam:  WARNING: IF YOU ARE ALLERGIC TO IODINE/X-RAY DYE, PLEASE NOTIFY RADIOLOGY IMMEDIATELY AT [phone]! YOU WILL BE GIVEN A 13 HOUR PREMEDICATION PREP.  1) Do not eat or drink anything after 4:30 am  (4 hours prior to your test)  You may take any medications as prescribed with a small amount of water except for the following: Metformin, Glucophage, Glucovance, Avandamet, Riomet, Fortamet, Actoplus Met, Janumet, Glumetza or Metaglip. The above medications must be held the day of the exam AND 48 hours after the exam.  The purpose of you drinking the oral contrast is to aid in the visualization of your intestinal tract. The contrast solution may cause some diarrhea. Before your exam is started, you will be given a small amount of fluid to drink. Depending on your individual set of symptoms, you may also receive an intravenous injection of x-ray contrast/dye. Plan on being at [facility] for 30 minutes or long, depending on the type of exam you are having performed.  If you have any questions regarding your exam or if you need to reschedule, you may call the CT department at [phone] between the hours of 8:00 am and 5:00 pm, Monday-Friday.  ________________________________________________________________________

## 2013-12-23 NOTE — Progress Notes (Addendum)
Subjective:    Patient ID: Brittney [name], female    DOB: [birth year], 80 y.o.   MRN: [id number]  HPI Brittney [name] is a very nice 80 year old African American female known recently to Dr. Hilarie Fredrickson who unfortunately has a metastatic pancreatic cancer with liver and SMV involvement. She has undergone chemotherapy and radiation and had undergone biliary stenting for obstruction in February of 2015. She is being followed by Dr. Benay Spice  and is not currently on any treatment. She had decided that the chemotherapy and radiation did not help and in fact made her feel worse. She was seen in the office about a week ago with complaints of pain near her rectum which is felt more likely bone pain and  also with progressive diarrhea. She has been having chronic problems with diarrhea of most likely due to malabsorption. She comes in today with complaints of worsening of her left sided abdominal and back pain and progressive diarrhea. She is also concerned about continued weight loss. No recent fever or chills. She says her appetite is fine and she eats a fair amount but does not feel that she is absorbing  her nutrients.She has ost another 3 pounds in the past week. She states she's having at least 7 or 8 loose to liquid bowel movements per day some days more. She has pain in her left abdomen which goes around into her left back. She has been using a lot of for pain and recently had increased her dose. She asks about what she can do to gain some weight. Currently taking Lomotil 3 times daily, and creon 2 tablets 3 times daily with meals and remains on Delzicol 3 tablets 3 times daily Most recent CT scan of the abdomen pelvis and chest were done in March of 2015 and are reviewed in care everywhere. Patient would like to be re- imaged   Review of Systems  Constitutional: Positive for appetite change and unexpected weight change.  Eyes: Negative.   Respiratory: Negative.   Gastrointestinal: Positive for abdominal pain  and diarrhea.  Endocrine: Negative.   Genitourinary: Negative.   Musculoskeletal: Positive for back pain.  Skin: Negative.   Allergic/Immunologic: Negative.   Neurological: Negative.   Hematological: Negative.    . Outpatient Prescriptions Prior to Visit  Medication Sig Dispense Refill  . amLODipine-valsartan (EXFORGE) 10-160 MG per tablet Take 1 tablet by mouth daily.      Marland Kitchen aspirin EC 81 MG tablet Take 81 mg by mouth daily.      . cholecalciferol (VITAMIN D) 1000 UNITS tablet Take 1,000 Units by mouth 2 (two) times daily.      . Cyanocobalamin (VITAMIN B-12 CR) 1000 MCG TBCR Take 1 tablet by mouth.      . diphenoxylate-atropine (LOMOTIL) 2.5-0.025 MG per tablet Take 1 tablet by mouth 4 (four) times daily as needed for diarrhea or loose stools.  60 tablet  3  . esomeprazole (NEXIUM) 40 MG capsule Take 1 capsule (40 mg total) by mouth daily before breakfast.  25 capsule  0  . HYDROmorphone (DILAUDID) 2 MG tablet Take 1-2 tablets (2-4 mg total) by mouth every 4 (four) hours as needed for severe pain.  100 tablet  0  . levothyroxine (SYNTHROID, LEVOTHROID) 125 MCG tablet Take 125 mcg by mouth daily.        Marland Kitchen lidocaine-prilocaine (EMLA) cream Apply to port site 1 hour prior to access.      . megestrol (MEGACE ORAL) 40 MG/ML suspension Take 5 mLs (  200 mg total) by mouth 2 (two) times daily.  300 mL  1  . Mesalamine (DELZICOL) 400 MG CPDR DR capsule Take 3 capsules (1,200 mg total) by mouth 3 (three) times daily as needed. For colitis flareup.  84 capsule  0  . Multiple Vitamins tablet Take 1 tablet by mouth.      . Pancrelipase, Lip-Prot-Amyl, (CREON) 36000 UNITS CPEP Take 2 capsules by mouth 3 (three) times daily with meals.  100 capsule  0  . Probiotic Product (ALIGN) 4 MG CAPS Take 1 capsule by mouth daily.      . rosuvastatin (CRESTOR) 10 MG tablet Take 10 mg by mouth daily.      . clonazePAM (KLONOPIN) 0.5 MG tablet Take 0.5 mg by mouth.       No facility-administered medications prior  to visit.   Allergies  Allergen Reactions  . Ivp Dye [Iodinated Diagnostic Agents] Other (See Comments)    Fainted.   Patient Active Problem List   Diagnosis Date Noted  . Malignant neoplasm of pancreas, part unspecified 11/23/2013  . Pancreatic contusion 11/03/2012  . Sternal fracture 11/03/2012  . Crohn disease 03/19/2011  . Segmental colitis 03/19/2011  . CROHN'S DISEASE-LARGE INTESTINE 01/25/2010  . OTH&UNSPEC NONINFECTIOUS GASTROENTERITIS&COLITIS 02/27/2009  . UNSPECIFIED URINARY INCONTINENCE 02/27/2009  . HYPOTHYROIDISM 04/29/2008  . HYPERLIPIDEMIA 04/29/2008  . GERD 04/29/2008  . BARRETTS ESOPHAGUS 04/29/2008  . HIATAL HERNIA 04/29/2008  . DIVERTICULOSIS, COLON 04/29/2008  . CONSTIPATION, CHRONIC 04/29/2008  . ARTHRITIS, RHEUMATOID 04/29/2008   History  Substance Use Topics  . Smoking status: Former Smoker -- 0.12 packs/day for 20 years    Types: Cigarettes    Quit date: 10/01/2011  . Smokeless tobacco: Never Used  . Alcohol Use: No   family history includes Colon cancer in her mother; Esophageal cancer in her brother; Heart disease in her father and mother.      Objective:   Physical Exam  well-developed older African American female in no acute distress, thin, pleasant blood pressure 120/72 pulse 82 height 5 foot 3 weight 111. HEENT; nontraumatic normocephalic EOMI PERRLA sclera anicteric, Supple; no JVD, Cardiovascular; regular rate and rhythm with S1-S2 no murmur or gallop, Pulmonary; clear bilaterally, Abdomen ;soft he is tender in the left upper quadrant left mid quadrant no definite palpable mass or hepatosplenomegaly no guarding bowel sounds are present, Rectal ;exam not done, Extremities; no clubbing cyanosis or edema skin warm and dry, Psych ;mood and affect appropriate        Assessment & Plan:  #9  80 year old female with metastatic pancreatic cancer with liver and SMV involvement. She had ascites at the time of her last CT scan in March of 2015.  Patient currently with persistent weight loss,progressive abdominal pain, and diarrhea both probably secondary to progression of her metastatic cancer. Patient states that she knows she's not going to be cured of her cancer but wants to have a good quality of life as long as she can. #2 history of segmental colitis #3 chronic GERD  #4 Arthritis  Plan; Stool for Cdiff PCR, CBC, CMEt Continue Creon at current dosage Increase Lomotil to 2 tabs  4 x daily Patient has increased Dilaudid this week and may need to titrate up further for pain control Will schedule for CT scan of the abdomen and pelvis with contrast Advised her to start  high-protein high-calorie shakes twice daily Patient relates she has a followup appointment with Dr. Benay Spice  In 2 weeks  Addendum: Reviewed and agree with  management. Unfortunately given progression of metastatic pancreatic cancer, I expect her abdominal pain and weight loss to progress.  Her narcotic pain medication can be titrated as needed. Agree with exclusion of infectious cause of diarrhea Jerene Bears, MD

## 2013-12-24 ENCOUNTER — Ambulatory Visit
Admission: RE | Admit: 2013-12-24 | Discharge: 2013-12-24 | Disposition: A | Discharge status: Home / Self Care | Payer: Medicare Other | Source: Ambulatory Visit | Attending: Physician Assistant | Admitting: Physician Assistant

## 2013-12-24 ENCOUNTER — Encounter (HOSPITAL_COMMUNITY): Payer: Self-pay

## 2013-12-24 ENCOUNTER — Other Ambulatory Visit (HOSPITAL_COMMUNITY): Payer: Self-pay | Admitting: Physician Assistant

## 2013-12-24 ENCOUNTER — Ambulatory Visit (HOSPITAL_COMMUNITY)
Admission: RE | Admit: 2013-12-24 | Discharge: 2013-12-24 | Disposition: A | Discharge status: Home / Self Care | Payer: Medicare Other | Source: Ambulatory Visit | Attending: Physician Assistant | Admitting: Physician Assistant

## 2013-12-24 DIAGNOSIS — R1012 Left upper quadrant pain: Secondary | ICD-10-CM

## 2013-12-24 DIAGNOSIS — C259 Malignant neoplasm of pancreas, unspecified: Secondary | ICD-10-CM

## 2013-12-24 DIAGNOSIS — M546 Pain in thoracic spine: Secondary | ICD-10-CM

## 2013-12-24 DIAGNOSIS — R188 Other ascites: Secondary | ICD-10-CM | POA: Insufficient documentation

## 2013-12-24 DIAGNOSIS — M549 Dorsalgia, unspecified: Secondary | ICD-10-CM | POA: Insufficient documentation

## 2013-12-24 DIAGNOSIS — M479 Spondylosis, unspecified: Secondary | ICD-10-CM | POA: Insufficient documentation

## 2013-12-24 HISTORY — DX: Malignant (primary) neoplasm, unspecified: C80.1

## 2013-12-24 MED ORDER — IOHEXOL 300 MG/ML  SOLN
100.0000 mL | Freq: Once | INTRAMUSCULAR | Status: AC | PRN
  Administered 2013-12-24: 100 mL via INTRAVENOUS

## 2013-12-26 LAB — CLOSTRIDIUM DIFFICILE BY PCR: Toxigenic C. Difficile by PCR: NOT DETECTED

## 2013-12-29 ENCOUNTER — Telehealth: Payer: Self-pay | Admitting: *Deleted

## 2013-12-29 NOTE — Telephone Encounter (Signed)
Left a message for patient to call me. 

## 2013-12-29 NOTE — Telephone Encounter (Signed)
Patient is calling for CT results. States she is weak with no energy and needs to know what to do for this. Please, advise.

## 2013-12-29 NOTE — Telephone Encounter (Signed)
As we discussed. Her pancreatic cancer is progressing and causing weakness , and pain. Ct scan shows pancreatic tumor stable, but some new lesions in liver- she has an appt with oncology this week and Dr Benay Spice can review the scan further , and help her decide how to manage

## 2013-12-30 NOTE — Telephone Encounter (Signed)
Left a message for patient to call me. 

## 2013-12-30 NOTE — Telephone Encounter (Signed)
Spoke with patient and gave her results and recommendations as per Nicoletta Ba, PA.

## 2014-01-06 ENCOUNTER — Ambulatory Visit (HOSPITAL_BASED_OUTPATIENT_CLINIC_OR_DEPARTMENT_OTHER): Payer: Medicare Other | Admitting: Nurse Practitioner

## 2014-01-06 ENCOUNTER — Ambulatory Visit: Payer: Medicare Other | Admitting: Oncology

## 2014-01-06 ENCOUNTER — Telehealth: Payer: Self-pay | Admitting: Oncology

## 2014-01-06 VITALS — BP 143/67 | HR 87 | Temp 98.4°F | Resp 19 | Ht 63.5 in | Wt 117.7 lb

## 2014-01-06 DIAGNOSIS — R197 Diarrhea, unspecified: Secondary | ICD-10-CM

## 2014-01-06 DIAGNOSIS — C787 Secondary malignant neoplasm of liver and intrahepatic bile duct: Secondary | ICD-10-CM

## 2014-01-06 DIAGNOSIS — R109 Unspecified abdominal pain: Secondary | ICD-10-CM

## 2014-01-06 DIAGNOSIS — M549 Dorsalgia, unspecified: Secondary | ICD-10-CM

## 2014-01-06 DIAGNOSIS — R634 Abnormal weight loss: Secondary | ICD-10-CM

## 2014-01-06 DIAGNOSIS — C259 Malignant neoplasm of pancreas, unspecified: Secondary | ICD-10-CM

## 2014-01-06 MED ORDER — HYDROMORPHONE HCL 2 MG PO TABS: 2.0000 mg | ORAL_TABLET | ORAL | Status: AC | PRN

## 2014-01-06 NOTE — Progress Notes (Addendum)
[place] OFFICE PROGRESS NOTE   Diagnosis:  Pancreas cancer.  INTERVAL HISTORY:   Ms. Giannattasio returns as scheduled. She continues to have loose bowel movements estimating 4-5 times a day. No bleeding. She notes crampy abdominal pain with bowel movements. She is taking 1 Creon tablet before meals. She takes Imodium as needed. She reports a good appetite. She notes her abdomen is distended. She has also noted some leg swelling. She is no longer having pain at the sacrum.  Objective:  Vital signs in last 24 hours:  Blood pressure 143/67, pulse 87, temperature 98.4 F (36.9 C), temperature source Oral, resp. rate 19, height 5' 3.5" (1.613 m), weight 117 lb 11.2 oz (53.388 kg), SpO2 100.00%.    HEENT: No thrush or ulcerations. Resp: Lungs clear. Cardio: Regular cardiac rhythm. GI: Abdomen distended. No hepatomegaly. No mass. Vascular: Pitting lower leg/ankle edema bilaterally.   Port-A-Cath site without erythema.    Lab Results:  Lab Results  Component Value Date   WBC 6.2 12/23/2013   HGB 10.9* 12/23/2013   HCT 32.7* 12/23/2013   MCV 87.8 12/23/2013   PLT 370.0 12/23/2013   NEUTROABS 4.9 12/23/2013    Imaging:  No results found.  Medications: I have reviewed the patient's current medications.  Assessment/Plan: 1. Pancreas cancer, locally advanced, diagnosed April 2014. Baseline CA 19-9 on 11/10/2012 elevated at 1003.  Gemcitabine/Abraxane initiated beginning 01/12/2013.  CA 19-9 improved.  Restaging MRI of the abdomen 03/09/2013 showed progressive superior mesenteric vein involvement.  Gemcitabine/Abraxane continued for an additional cycle.  Radiation and concurrent Xeloda chemotherapy beginning 04/27/2013 for 28 fractions.  Restaging CT scan 07/01/2013 with findings concerning for local progression.  Treatment break through December 2014.  Hospitalization with hyperbilirubinemia January 2015. Obstruction of the distal common bile duct. Percutaneous  biliary drain placed on 08/05/2013. Percutaneous bile duct stent placed on 09/20/2013.  Treatment resumed with gemcitabine and Abraxane on 08/19/2012.  Restaging CT evaluation 10/20/2013 showed a new liver lesion concerning for hepatic metastasis; increasing abdominal ascites. CT abdomen/pelvis 12/24/2013 (compared to an abdominal ultrasound 11/26/2013 and abdominal pelvic MRI 11/04/2012). Mass involving the pancreatic head less well-defined and slightly smaller. Superior mesenteric vein occluded. Multiple venous collaterals. At least 2 metastases present within the lateral segment of left hepatic lobe. Additional right lobe metastases suspected. Ascites without peritoneal nodularity. 2. January 2015 hyperbilirubinemia due to obstruction of the distal common bile duct. Percutaneous biliary drain placed 08/05/2013. Percutaneous bile duct stent placed on 09/20/2013. 3. Port-A-Cath placement 03/12/2013. 4. Weight loss. She has lost 40 pounds over the past year. 5. Abdominal/back pain secondary to #1. She takes Dilaudid as needed. 6. History of hypertension. 7. Hypothyroid on replacement. 8. Diarrhea likely secondary to #1. Currently on Creon but not taking as prescribed.   Disposition: Her clinical status appears stable. She confirmed at today's visit that she is not interested in chemotherapy. We will continue to follow on a supportive care approach.  We will ask for a direct comparison of the recent CT to a CT scan done at [facility] on 10/20/2013.  She continues to have loose stools. She will try to take the Creon as prescribed.  We will see her in followup in approximately 5 weeks with a Port-A-Cath flush. She will contact the office in the interim with any problems. She was provided with a new Dilaudid prescription at today's visit.  Patient seen with Dr. Benay Spice.    Ned Card ANP/GNP-BC   01/06/2014  1:42 PM  This was a shared  visit with Ned Card.  The plan is to follow her with  supportive care. The pain over the right pelvis has resolved.  Julieanne Manson, M.D.

## 2014-01-06 NOTE — Telephone Encounter (Signed)
gv adn printed appt sched and avs for pt for July  °

## 2014-01-07 ENCOUNTER — Inpatient Hospital Stay
Admission: RE | Admit: 2014-01-07 | Discharge: 2014-01-07 | Disposition: A | Discharge status: Home / Self Care | Payer: Self-pay | Source: Ambulatory Visit | Attending: Oncology | Admitting: Oncology

## 2014-01-07 ENCOUNTER — Other Ambulatory Visit: Payer: Self-pay | Admitting: Oncology

## 2014-01-07 DIAGNOSIS — C259 Malignant neoplasm of pancreas, unspecified: Secondary | ICD-10-CM

## 2014-01-11 ENCOUNTER — Telehealth: Payer: Self-pay | Admitting: *Deleted

## 2014-01-11 ENCOUNTER — Other Ambulatory Visit: Payer: Self-pay | Admitting: Physician Assistant

## 2014-01-11 ENCOUNTER — Other Ambulatory Visit (HOSPITAL_COMMUNITY): Payer: Medicare Other

## 2014-01-11 DIAGNOSIS — C259 Malignant neoplasm of pancreas, unspecified: Secondary | ICD-10-CM

## 2014-01-11 MED ORDER — IOHEXOL 300 MG/ML  SOLN
100.0000 mL | Freq: Once | INTRAMUSCULAR | Status: AC | PRN
  Administered 2014-01-11: 100 mL via INTRAVENOUS

## 2014-01-11 MED ORDER — IOHEXOL 300 MG/ML  SOLN: 100.0000 mL | Freq: Once | INTRAMUSCULAR | Status: DC | PRN

## 2014-01-11 NOTE — Telephone Encounter (Signed)
CT scan shows known pancreatic cancer and likely progression of metastatic disease in the liver The imaged bowel appeared unremarkable without inflammation, narrowing or obstruction There is ascites/fluid in the abdomen It is best to discuss this with her oncologist, Dr. Benay Spice for further oncologic opinion JMP ----- Message ----- From: Hulan Saas, RN Sent: 01/11/2014 3:06 PM To: Jerene Bears, MD  Left a message for patient to call me.

## 2014-01-11 NOTE — Telephone Encounter (Signed)
Patient given results and recommendation. 

## 2014-01-20 ENCOUNTER — Other Ambulatory Visit: Payer: Self-pay | Admitting: *Deleted

## 2014-01-20 ENCOUNTER — Ambulatory Visit (HOSPITAL_COMMUNITY): Admission: RE | Admit: 2014-01-20 | Payer: Medicare Other | Source: Ambulatory Visit

## 2014-01-20 ENCOUNTER — Telehealth: Payer: Self-pay | Admitting: *Deleted

## 2014-01-20 DIAGNOSIS — C259 Malignant neoplasm of pancreas, unspecified: Secondary | ICD-10-CM

## 2014-01-20 NOTE — Telephone Encounter (Signed)
Pt called requesting paracentesis; reports "my left side is hard/tight, right side is flexible and I get SOB in am/pm"  Per Dr. Benay Spice; notified pt that IR can get her in 01/21/14 arrive at 1:45.  Pt verbalized understanding and expressed appreciation.

## 2014-01-21 ENCOUNTER — Other Ambulatory Visit: Payer: Self-pay | Admitting: Oncology

## 2014-01-21 ENCOUNTER — Ambulatory Visit (HOSPITAL_COMMUNITY)
Admission: RE | Admit: 2014-01-21 | Discharge: 2014-01-21 | Disposition: A | Discharge status: Home / Self Care | Payer: Medicare Other | Source: Ambulatory Visit | Attending: Oncology | Admitting: Oncology

## 2014-01-21 DIAGNOSIS — R188 Other ascites: Secondary | ICD-10-CM | POA: Insufficient documentation

## 2014-01-21 DIAGNOSIS — C259 Malignant neoplasm of pancreas, unspecified: Secondary | ICD-10-CM

## 2014-01-21 DIAGNOSIS — Z5309 Procedure and treatment not carried out because of other contraindication: Secondary | ICD-10-CM | POA: Insufficient documentation

## 2014-01-21 NOTE — Progress Notes (Signed)
Patient presented today for an ultrasound guided paracentesis. Upon reviewing ultrasound images, minimal ascites is seen. Procedure was cancelled.  Tsosie Billing PA-C Interventional Radiology  01/21/14  2:41 PM

## 2014-01-25 ENCOUNTER — Telehealth: Payer: Self-pay | Admitting: *Deleted

## 2014-01-25 NOTE — Telephone Encounter (Signed)
Left VM "I'm having complications. Please call me".

## 2014-01-25 NOTE — Telephone Encounter (Signed)
Called patient who says stools are greasy and light to brown in color.  The following orders received and read back from [facility] NP.  1 oz Clear liquids every 15 minutes to determine how tolerates and increase to full cup if tolerates..  Continue imodium as ordered and call in am with update.  Reviewed what to use for clear liquids and to not eat fruits vegetables as the increased fiber can increase stools.  Instructed to check temperature and to call for temp > 100.5.  Has not used nexium.  Instructed to try this with an ounce water.

## 2014-01-25 NOTE — Telephone Encounter (Addendum)
   Brittney [name] input needed: Diarrhea weekly   Reason for call: Ten bowel movements from 2:00 am until 6:00 am  Gastrointestinal: positive for abdominal pain, change in bowel habits, diarrhea and "passing so much gas and back pain" "Abdominal cramping and back pain"   ALLERGIES:  has no active allergies.  Patient last received chemotherapy/ treatment on n/a  Patient was last seen in the office on 01-06-2014  Next appt is 02-10-2014  Is patient having fevers greater than 100.5?  no, Temp checked at time of call = 99.4.  Reports feeling cold but denies chills.  Sweating a little.   Is patient having uncontrolled pain, or new pain? No, has taken a dilaudid pain pill which helped relieve pain.   Is patient having new back pain that changes with position (worsens or eases when laying down?)  no   Is patient able to eat and drink? yes, "ate a hamburger last night and drinks two ensure.  Took the creon last night but I don't take it unless I eat.      Is patient able to pass stool without difficulty?   no, "Back pain and gas all over my body and loud gas expelled from my rectum ."     Is patient having uncontrolled nausea?  no    patient calls 01/25/2014 with complaint of  Gastrointestinal: positive for abdominal pain, change in bowel habits and diarrhea Has ten regular bm's from 2 am till 6:00 am and then the bowels changed to diarrhea at 0600.  I've had two loose stools since 0600.  I am passing a lot of gas,  I have no desire to eat.  Drank 8 oz pedialyte and 8 oz water. I don't know if this is from the pancreas cancer or the ensure.  I am lactose intolerant. My abdomen is not swollen or firm to touch.  This is starting to happen every week for the past four months.  The odor is similar to cabbage but the same smell every time.  I took immodium twice so far and a prescription liquid once."       Summary Based on the above information advised patient to  Await return call to mobile or home line  after information is shared with A.P.P.   [name], Brittney  01/25/2014, 12:49 PM   Background Info  Brittney [name]   DOB: [birth year]   MR#: [id number]   CSN#   [id number] 01/25/2014

## 2014-01-26 ENCOUNTER — Telehealth: Payer: Self-pay | Admitting: *Deleted

## 2014-01-26 MED ORDER — BOOST BREEZE PO LIQD: 1.0000 | Freq: Two times a day (BID) | ORAL | Status: DC

## 2014-01-26 NOTE — Telephone Encounter (Signed)
Call from pt with update on status. She reports she feels 100% better, advanced her diet this morning. Tolerating POs without difficulty. Stated she requested clear liquid supplement from pharmacy, they could not recommend a strength- will need Rx.

## 2014-02-02 ENCOUNTER — Ambulatory Visit (HOSPITAL_BASED_OUTPATIENT_CLINIC_OR_DEPARTMENT_OTHER): Payer: Medicare Other

## 2014-02-02 ENCOUNTER — Other Ambulatory Visit: Payer: Self-pay | Admitting: *Deleted

## 2014-02-02 ENCOUNTER — Ambulatory Visit (HOSPITAL_BASED_OUTPATIENT_CLINIC_OR_DEPARTMENT_OTHER): Payer: Medicare Other | Admitting: Nurse Practitioner

## 2014-02-02 ENCOUNTER — Other Ambulatory Visit (HOSPITAL_BASED_OUTPATIENT_CLINIC_OR_DEPARTMENT_OTHER): Payer: Medicare Other

## 2014-02-02 ENCOUNTER — Telehealth: Payer: Self-pay | Admitting: *Deleted

## 2014-02-02 VITALS — BP 131/86 | HR 75 | Temp 98.4°F | Resp 18

## 2014-02-02 DIAGNOSIS — C259 Malignant neoplasm of pancreas, unspecified: Secondary | ICD-10-CM

## 2014-02-02 DIAGNOSIS — E876 Hypokalemia: Secondary | ICD-10-CM

## 2014-02-02 DIAGNOSIS — E860 Dehydration: Secondary | ICD-10-CM

## 2014-02-02 DIAGNOSIS — R109 Unspecified abdominal pain: Secondary | ICD-10-CM

## 2014-02-02 DIAGNOSIS — R5381 Other malaise: Secondary | ICD-10-CM

## 2014-02-02 DIAGNOSIS — R5383 Other fatigue: Secondary | ICD-10-CM

## 2014-02-02 LAB — URINALYSIS, MICROSCOPIC - CHCC
BILIRUBIN (URINE): NEGATIVE
BLOOD: NEGATIVE
Glucose: NEGATIVE mg/dL
Ketones: NEGATIVE mg/dL
Nitrite: NEGATIVE
Protein: <30 mg/dL
SPECIFIC GRAVITY, URINE: 1.015 (ref 1.003–1.035)
Urobilinogen, UR: 0.2 mg/dL (ref 0.2–1)
pH: 6.5 (ref 4.6–8.0)

## 2014-02-02 LAB — CBC WITH DIFFERENTIAL/PLATELET
BASO%: 0.2 % (ref 0.0–2.0)
Basophils Absolute: 0 10*3/uL (ref 0.0–0.1)
EOS ABS: 0.1 10*3/uL (ref 0.0–0.5)
EOS%: 1.1 % (ref 0.0–7.0)
HEMATOCRIT: 29.3 % — AB (ref 34.8–46.6)
HGB: 9.5 g/dL — ABNORMAL LOW (ref 11.6–15.9)
LYMPH%: 5.5 % — AB (ref 14.0–49.7)
MCH: 29.2 pg (ref 25.1–34.0)
MCHC: 32.3 g/dL (ref 31.5–36.0)
MCV: 90.2 fL (ref 79.5–101.0)
MONO#: 0.5 10*3/uL (ref 0.1–0.9)
MONO%: 7.6 % (ref 0.0–14.0)
NEUT%: 85.6 % — AB (ref 38.4–76.8)
NEUTROS ABS: 5.9 10*3/uL (ref 1.5–6.5)
Platelets: 280 10*3/uL (ref 145–400)
RBC: 3.25 10*6/uL — AB (ref 3.70–5.45)
RDW: 15.7 % — ABNORMAL HIGH (ref 11.2–14.5)
WBC: 6.9 10*3/uL (ref 3.9–10.3)
lymph#: 0.4 10*3/uL — ABNORMAL LOW (ref 0.9–3.3)

## 2014-02-02 LAB — COMPREHENSIVE METABOLIC PANEL (CC13)
ALK PHOS: 56 U/L (ref 40–150)
ALT: 14 U/L (ref 0–55)
AST: 20 U/L (ref 5–34)
Albumin: 2.8 g/dL — ABNORMAL LOW (ref 3.5–5.0)
Anion Gap: 7 mEq/L (ref 3–11)
BILIRUBIN TOTAL: 0.46 mg/dL (ref 0.20–1.20)
BUN: 6.3 mg/dL — ABNORMAL LOW (ref 7.0–26.0)
CO2: 26 mEq/L (ref 22–29)
Calcium: 8.5 mg/dL (ref 8.4–10.4)
Chloride: 100 mEq/L (ref 98–109)
Creatinine: 0.7 mg/dL (ref 0.6–1.1)
Glucose: 155 mg/dl — ABNORMAL HIGH (ref 70–140)
Potassium: 3.2 mEq/L — ABNORMAL LOW (ref 3.5–5.1)
SODIUM: 133 meq/L — AB (ref 136–145)
Total Protein: 6.5 g/dL (ref 6.4–8.3)

## 2014-02-02 MED ORDER — POTASSIUM CHLORIDE CRYS ER 20 MEQ PO TBCR
40.0000 meq | EXTENDED_RELEASE_TABLET | Freq: Once | ORAL | Status: AC
  Administered 2014-02-02: 40 meq via ORAL
  Filled 2014-02-02: qty 2

## 2014-02-02 MED ORDER — SODIUM CHLORIDE 0.9 % IJ SOLN
10.0000 mL | INTRAMUSCULAR | Status: DC | PRN
  Administered 2014-02-02: 10 mL via INTRAVENOUS
  Filled 2014-02-02: qty 10

## 2014-02-02 MED ORDER — HEPARIN SOD (PORK) LOCK FLUSH 100 UNIT/ML IV SOLN
500.0000 [IU] | Freq: Once | INTRAVENOUS | Status: AC
  Administered 2014-02-02: 500 [IU]
  Filled 2014-02-02: qty 5

## 2014-02-02 MED ORDER — SODIUM CHLORIDE 0.9 % IV SOLN
1000.0000 mL | INTRAVENOUS | Status: DC
  Administered 2014-02-02: 1000 mL via INTRAVENOUS

## 2014-02-02 MED ORDER — POTASSIUM CHLORIDE CRYS ER 20 MEQ PO TBCR: 20.0000 meq | EXTENDED_RELEASE_TABLET | Freq: Every day | ORAL | Status: DC

## 2014-02-02 MED ORDER — SODIUM CHLORIDE 0.9 % IJ SOLN
10.0000 mL | Freq: Once | INTRAMUSCULAR | Status: AC
  Administered 2014-02-02: 10 mL
  Filled 2014-02-02: qty 10

## 2014-02-02 MED ORDER — SODIUM CHLORIDE 0.9 % IV SOLN: INTRAVENOUS | Status: DC

## 2014-02-02 MED ORDER — POTASSIUM CHLORIDE ER 20 MEQ PO TBCR: 1.0000 | EXTENDED_RELEASE_TABLET | Freq: Every day | ORAL | Status: AC

## 2014-02-02 NOTE — Telephone Encounter (Signed)
Verbal order received and read back from Selena Lesser NP for labs with blood culture and UA, IVF and patient will be seen in infusion area.

## 2014-02-02 NOTE — Telephone Encounter (Signed)
   Provider input needed: Pancreatic cancer symptoms   Reason for call: "I need labs to see if an infection is going on.  I am lifeless, no energy and would like to be seen or admitted to the hospital."  Respiratory: positive for dyspnea on exertion Gastrointestinal: positive for abdominal pain, diarrhea and "Cramping, swelling, bloating to abdomen" "Sweating, chills, swelling to ankles, weakness"   ALLERGIES:  has no active allergies.  Patient last received chemotherapy/ treatment on None currently.  Reports chemotherapy finished at [facility] in April 2015 [facility])  Patient was last seen in the office on 01-06-2014  Next appt is 02-10-2014  Is patient having fevers greater than 100.5?  no, temp = 98.4 at 8:30am.  At 12:50 T = 99.3.  "I need a new thermometer because this never beeped after three minutes"   Is patient having uncontrolled pain, or new pain? no, "I feel pain to my right side of abdomen most.  Took hydromorphone 2 mg at 1230."   Is patient having new back pain that changes with position (worsens or eases when laying down?)  no   Is patient able to eat and drink? yes, Reports good appetite taking Breeze, eating soft foods.    Is patient able to pass stool without difficulty?   no, "using Imodium as needed.  Two loose stools today, nine on Monday.  I average four to five stools daily."       Is patient having uncontrolled nausea?  no    patient calls 02/02/2014 with complaint of  Constitutional: positive for chills, fatigue, sweats and "swelling to both ankles" Respiratory: positive for dyspnea on exertion Gastrointestinal: positive for abdominal pain, diarrhea and "cramping, abdominal swelling, sweating from my head down.  I had to get up overnight to change my gown.  My bowels are not a problem today.  My problem is I have no energy, am so weak, lifeless and abdominal pain.  I need to see the doctor or be admitted.  I need labs checked to make sure an  infection, diabetes or something is not going on.  Could this be from the spot they found on my liver?    Summary Based on the above information advised patient to  Await return call to [phone] after this nurse shares symptoms with APP.   Brittney [name]  02/02/2014, 12:46 PM   Background Info  Brittney [name]   DOB: 12/02/1931   MR#: [id number]   CSN#   [id number] 02/02/2014

## 2014-02-02 NOTE — Patient Instructions (Signed)

## 2014-02-02 NOTE — Patient Instructions (Addendum)
Dehydration, Adult Dehydration is when you lose more fluids from the body than you take in. Vital organs like the kidneys, brain, and heart cannot function without a proper amount of fluids and salt. Any loss of fluids from the body can cause dehydration.  CAUSES   Vomiting.  Diarrhea.  Excessive sweating.  Excessive urine output.  Fever. SYMPTOMS  Mild dehydration  Thirst.  Dry lips.  Slightly dry mouth. Moderate dehydration  Very dry mouth.  Sunken eyes.  Skin does not bounce back quickly when lightly pinched and released.  Dark urine and decreased urine production.  Decreased tear production.  Headache. Severe dehydration  Very dry mouth.  Extreme thirst.  Rapid, weak pulse (more than 100 beats per minute at rest).  Cold hands and feet.  Not able to sweat in spite of heat and temperature.  Rapid breathing.  Blue lips.  Confusion and lethargy.  Difficulty being awakened.  Minimal urine production.  No tears. DIAGNOSIS  Your caregiver will diagnose dehydration based on your symptoms and your exam. Blood and urine tests will help confirm the diagnosis. The diagnostic evaluation should also identify the cause of dehydration. TREATMENT  Treatment of mild or moderate dehydration can often be done at home by increasing the amount of fluids that you drink. It is best to drink small amounts of fluid more often. Drinking too much at one time can make vomiting worse. Refer to the home care instructions below. Severe dehydration needs to be treated at the hospital where you will probably be given intravenous (IV) fluids that contain water and electrolytes. HOME CARE INSTRUCTIONS   Ask your caregiver about specific rehydration instructions.  Drink enough fluids to keep your urine clear or pale yellow.  Drink small amounts frequently if you have nausea and vomiting.  Eat as you normally do.  Avoid:  Foods or drinks high in sugar.  Carbonated  drinks.  Juice.  Extremely hot or cold fluids.  Drinks with caffeine.  Fatty, greasy foods.  Alcohol.  Tobacco.  Overeating.  Gelatin desserts.  Wash your hands well to avoid spreading bacteria and viruses.  Only take over-the-counter or prescription medicines for pain, discomfort, or fever as directed by your caregiver.  Ask your caregiver if you should continue all prescribed and over-the-counter medicines.  Keep all follow-up appointments with your caregiver. SEEK MEDICAL CARE IF:  You have abdominal pain and it increases or stays in one area (localizes).  You have a rash, stiff neck, or severe headache.  You are irritable, sleepy, or difficult to awaken.  You are weak, dizzy, or extremely thirsty. SEEK IMMEDIATE MEDICAL CARE IF:   You are unable to keep fluids down or you get worse despite treatment.  You have frequent episodes of vomiting or diarrhea.  You have blood or green matter (bile) in your vomit.  You have blood in your stool or your stool looks black and tarry.  You have not urinated in 6 to 8 hours, or you have only urinated a small amount of very dark urine.  You have a fever.  You faint. MAKE SURE YOU:   Understand these instructions.  Will watch your condition.  Will get help right away if you are not doing well or get worse. Document Released: 07/15/2005 Document Revised: 10/07/2011 Document Reviewed: 03/04/2011 ExitCare Patient Information 2015 [employer]. This information is not intended to replace advice given to you by your health care provider. Make sure you discuss any questions you have with your health care   provider. Hypokalemia Hypokalemia means that the amount of potassium in the blood is lower than normal.Potassium is a chemical, called an electrolyte, that helps regulate the amount of fluid in the body. It also stimulates muscle contraction and helps nerves function properly.Most of the body's potassium is inside of  cells, and only a very small amount is in the blood. Because the amount in the blood is so small, minor changes can be life-threatening. CAUSES  Antibiotics.  Diarrhea or vomiting.  Using laxatives too much, which can cause diarrhea.  Chronic kidney disease.  Water pills (diuretics).  Eating disorders (bulimia).  Low magnesium level.  Sweating a lot. SIGNS AND SYMPTOMS  Weakness.  Constipation.  Fatigue.  Muscle cramps.  Mental confusion.  Skipped heartbeats or irregular heartbeat (palpitations).  Tingling or numbness. DIAGNOSIS  Your health care provider can diagnose hypokalemia with blood tests. In addition to checking your potassium level, your health care provider may also check other lab tests. TREATMENT Hypokalemia can be treated with potassium supplements taken by mouth or adjustments in your current medicines. If your potassium level is very low, you may need to get potassium through a vein (IV) and be monitored in the hospital. A diet high in potassium is also helpful. Foods high in potassium are:  Nuts, such as peanuts and pistachios.  Seeds, such as sunflower seeds and pumpkin seeds.  Peas, lentils, and lima beans.  Whole grain and bran cereals and breads.  Fresh fruit and vegetables, such as apricots, avocado, bananas, cantaloupe, kiwi, oranges, tomatoes, asparagus, and potatoes.  Orange and tomato juices.  Red meats.  Fruit yogurt. HOME CARE INSTRUCTIONS  Take all medicines as prescribed by your health care provider.  Maintain a healthy diet by including nutritious food, such as fruits, vegetables, nuts, whole grains, and lean meats.  If you are taking a laxative, be sure to follow the directions on the label. SEEK MEDICAL CARE IF:  Your weakness gets worse.  You feel your heart pounding or racing.  You are vomiting or having diarrhea.  You are diabetic and having trouble keeping your blood glucose in the normal range. SEEK IMMEDIATE  MEDICAL CARE IF:  You have chest pain, shortness of breath, or dizziness.  You are vomiting or having diarrhea for more than 2 days.  You faint. MAKE SURE YOU:   Understand these instructions.  Will watch your condition.  Will get help right away if you are not doing well or get worse. Document Released: 07/15/2005 Document Revised: 05/05/2013 Document Reviewed: 01/15/2013 ExitCare Patient Information 2015 [employer]. This information is not intended to replace advice given to you by your health care provider. Make sure you discuss any questions you have with your health care provider.  

## 2014-02-02 NOTE — Addendum Note (Signed)
Addended by: Cherylynn Ridges on: 02/02/2014 02:44 PM   Modules accepted: Orders, Medications

## 2014-02-03 ENCOUNTER — Other Ambulatory Visit: Payer: Self-pay | Admitting: Oncology

## 2014-02-03 ENCOUNTER — Ambulatory Visit (HOSPITAL_COMMUNITY)
Admission: RE | Admit: 2014-02-03 | Discharge: 2014-02-03 | Disposition: A | Discharge status: Home / Self Care | Payer: Medicare Other | Source: Ambulatory Visit | Attending: Oncology | Admitting: Oncology

## 2014-02-03 ENCOUNTER — Encounter: Payer: Self-pay | Admitting: Nurse Practitioner

## 2014-02-03 DIAGNOSIS — R141 Gas pain: Secondary | ICD-10-CM | POA: Insufficient documentation

## 2014-02-03 DIAGNOSIS — R0989 Other specified symptoms and signs involving the circulatory and respiratory systems: Secondary | ICD-10-CM | POA: Insufficient documentation

## 2014-02-03 DIAGNOSIS — R142 Eructation: Secondary | ICD-10-CM

## 2014-02-03 DIAGNOSIS — R5383 Other fatigue: Secondary | ICD-10-CM | POA: Insufficient documentation

## 2014-02-03 DIAGNOSIS — Z5320 Procedure and treatment not carried out because of patient's decision for unspecified reasons: Secondary | ICD-10-CM | POA: Insufficient documentation

## 2014-02-03 DIAGNOSIS — C259 Malignant neoplasm of pancreas, unspecified: Secondary | ICD-10-CM

## 2014-02-03 DIAGNOSIS — R109 Unspecified abdominal pain: Secondary | ICD-10-CM | POA: Insufficient documentation

## 2014-02-03 DIAGNOSIS — E860 Dehydration: Secondary | ICD-10-CM | POA: Insufficient documentation

## 2014-02-03 DIAGNOSIS — R0609 Other forms of dyspnea: Secondary | ICD-10-CM | POA: Insufficient documentation

## 2014-02-03 DIAGNOSIS — R143 Flatulence: Secondary | ICD-10-CM

## 2014-02-03 DIAGNOSIS — R188 Other ascites: Secondary | ICD-10-CM | POA: Insufficient documentation

## 2014-02-03 LAB — URINE CULTURE

## 2014-02-03 NOTE — Assessment & Plan Note (Signed)
Most likely secondary to cancer diagnosis.  Advised patient to remain as active as possible.

## 2014-02-03 NOTE — Assessment & Plan Note (Signed)
Patient appeared in very good spirits on exam; and was eating soup and crackers. She was also able to tolerate full cup of hot chocolate with no difficulty. She stated that she is no longer having any abdominal pain; but that her abd remains increasingly distended. Advised patient that she may very well need a therapeutic paracentesis this week.  Advised patient that she will be called in the morning for further follow up and possible scheduling of procedure.
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
## 2014-02-03 NOTE — Assessment & Plan Note (Signed)
Patient given 1liter NS IV fluid rehydration today.

## 2014-02-03 NOTE — Assessment & Plan Note (Signed)
K+ was 3.2 today. Patient given potassium 40 meq PO today; and was also given potassium 20 meq PO daily x 7 days prescription.

## 2014-02-03 NOTE — Progress Notes (Addendum)
Patient ID: Brittney [name], female   DOB: 1931/12/19, 80 y.o.   MRN: [id number] Pt presented to [place] dept today for therapeutic paracentesis. Limited US of abd in all four quadrants reveals small amount of ascites superficial to bladder and rt lateral abdominal region. Findings d/w pt and she wishes to postpone fluid removal until more accumulates or she becomes more symptomatic.

## 2014-02-03 NOTE — Assessment & Plan Note (Signed)
Patient has elected to defer any further active tx of her pancreatic cancer. She remains interested in therapeutic paracentesis if needed for pain management.

## 2014-02-07 ENCOUNTER — Telehealth: Payer: Self-pay | Admitting: *Deleted

## 2014-02-07 NOTE — Telephone Encounter (Signed)
   Provider input needed: Diarrhea   Reason for call  Respiratory: positive for dyspnea on exertion Gastrointestinal: positive for diarrhea and excess gas. Reports having 3-4 loose stools/day. Takes Lomotil 5 mg daily and her Creon # 1 each time she eats. Bilateral swelling in her legs and pelvic area  ALLERGIES:  has no active allergies.  Patient last received chemotherapy/ treatment on n/a  Patient was last seen in the office on 02/02/14   Next appt is 02/10/14  Is patient having fevers greater than 100.5?  no   Is patient having uncontrolled pain, or new pain? no   Is patient having new back pain that changes with position (worsens or eases when laying down?)  no   Is patient able to eat and drink? Yes-ate cheesecake Sunday, which may have worsened her diarrhea  Is patient able to pass stool without difficulty?   Yes, diarrhea     Is patient having uncontrolled nausea?  no    Summary Based on the above information advised patient to  Stay with bland, no residue diet, push po fluids, increase her Lomotil to 2.5 mg qid. Continue her Creon #2 with meals. OK to try OTC Gas-X prn. Informed her that these are symptoms to expect with her disease process and best we can do is symptom management since she has declined treatment. Mentioned Hospice and she is receptive to this, but will have her husband come with her to appointment on Thursday so he can talk with MD and have true understanding of her situation. She reports that her husband does not think she is as sick as she is. Instructed her to elevate her legs above her heart as often as possible- swelling could be lymphedema related to her cancer.   Tania Ade  02/07/2014, 12:12 PM   Background Info  Brittney [name]   DOB: [birth year]   MR#: [id number]   CSN#   [id number] 02/07/2014

## 2014-02-08 ENCOUNTER — Telehealth: Payer: Self-pay | Admitting: *Deleted

## 2014-02-08 NOTE — Telephone Encounter (Signed)
PT. WOULD LIKE TO HAVE THE PARACENTESIS DONE ON 02/10/14. NOTIFIED DR.SHERRILL'S NURSE, TANYA WHITLOCK,RN. VERBAL ORDER AND READ BACK TO DR.SHERRILL- WILL EVALUATE PT. AT HER 02/10/14 APPOINTMENT AND DECIDE IF A PARACENTESIS IS NEEDED. NOTIFIED PT. SHE VOICES UNDERSTANDING.

## 2014-02-10 ENCOUNTER — Other Ambulatory Visit: Payer: Self-pay | Admitting: Oncology

## 2014-02-10 ENCOUNTER — Telehealth: Payer: Self-pay | Admitting: Oncology

## 2014-02-10 ENCOUNTER — Ambulatory Visit (HOSPITAL_BASED_OUTPATIENT_CLINIC_OR_DEPARTMENT_OTHER): Payer: Medicare Other

## 2014-02-10 ENCOUNTER — Ambulatory Visit (HOSPITAL_BASED_OUTPATIENT_CLINIC_OR_DEPARTMENT_OTHER): Payer: Medicare Other | Admitting: Oncology

## 2014-02-10 VITALS — BP 152/81 | HR 87 | Temp 98.4°F | Resp 18 | Ht 63.5 in | Wt 131.3 lb

## 2014-02-10 DIAGNOSIS — C259 Malignant neoplasm of pancreas, unspecified: Secondary | ICD-10-CM

## 2014-02-10 DIAGNOSIS — R609 Edema, unspecified: Secondary | ICD-10-CM

## 2014-02-10 DIAGNOSIS — R197 Diarrhea, unspecified: Secondary | ICD-10-CM

## 2014-02-10 DIAGNOSIS — R109 Unspecified abdominal pain: Secondary | ICD-10-CM

## 2014-02-10 DIAGNOSIS — Z95828 Presence of other vascular implants and grafts: Secondary | ICD-10-CM

## 2014-02-10 MED ORDER — SODIUM CHLORIDE 0.9 % IJ SOLN
10.0000 mL | INTRAMUSCULAR | Status: DC | PRN
  Administered 2014-02-10: 10 mL via INTRAVENOUS
  Filled 2014-02-10: qty 10

## 2014-02-10 MED ORDER — HEPARIN SOD (PORK) LOCK FLUSH 100 UNIT/ML IV SOLN
500.0000 [IU] | Freq: Once | INTRAVENOUS | Status: AC
  Administered 2014-02-10: 500 [IU] via INTRAVENOUS
  Filled 2014-02-10: qty 5

## 2014-02-10 NOTE — Telephone Encounter (Signed)
Pt confirmed labs/ov per 07/16 POF, gave pt medication for CT and AVS......KJ

## 2014-02-10 NOTE — Progress Notes (Signed)
[facility] OFFICE PROGRESS NOTE   Diagnosis: Pancreas cancer  INTERVAL HISTORY:   Brittney [name] [name] returns as scheduled. She has multiple complaints including abdominal pain, frequent loose bowel movements, flatulence, and increased abdominal distention. She also reports exertional dyspnea. The pain is relieved with 2 Dilaudid tablets per day. She is not taking Creon on a consistent schedule. She drinks 2 cans of Boost per day.  An ultrasound of the abdomen 02/03/2014 did not reveal adequate ascites for a paracentesis.  She has developed leg edema.  Objective:  Vital signs in last 24 hours:  Blood pressure 152/81, pulse 87, temperature 98.4 F (36.9 C), temperature source Oral, resp. rate 18, height 5' 3.5" (1.613 m), weight 131 lb 4.8 oz (59.557 kg).    Resp: Lungs clear bilaterally Cardio: Regular rate and rhythm GI: The abdomen is distended. No mass, nontender. Vascular: 1+ pitting edema below the knee bilaterally    Lab Results:  Lab Results  Component Value Date   WBC 6.9 02/02/2014   HGB 9.5* 02/02/2014   HCT 29.3* 02/02/2014   MCV 90.2 02/02/2014   PLT 280 02/02/2014   NEUTROABS 5.9 02/02/2014   Potassium 3.2, creatinine 0.7, albumin 2.8 Medications: I have reviewed the patient's current medications.  Assessment/Plan: 1. Pancreas cancer, locally advanced, diagnosed April 2014. Baseline CA 19-9 on 11/10/2012 elevated at 1003.  Gemcitabine/Abraxane initiated beginning 01/12/2013.  CA 19-9 improved.  Restaging MRI of the abdomen 03/09/2013 showed progressive superior mesenteric vein involvement.  Gemcitabine/Abraxane continued for an additional cycle.  Radiation and concurrent Xeloda chemotherapy beginning 04/27/2013 for 28 fractions.  Restaging CT scan 07/01/2013 with findings concerning for local progression.  Treatment break through December 2014.  Hospitalization with hyperbilirubinemia January 2015. Obstruction of the distal common bile duct. Percutaneous  biliary drain placed on 08/05/2013. Percutaneous bile duct stent placed on 09/20/2013.  Treatment resumed with gemcitabine and Abraxane on 08/19/2012.  Restaging CT evaluation 10/20/2013 showed a new liver lesion concerning for hepatic metastasis; increasing abdominal ascites.  CT abdomen/pelvis 12/24/2013 (compared to an abdominal ultrasound 11/26/2013 and abdominal pelvic MRI 11/04/2012). Mass involving the pancreatic head less well-defined and slightly smaller. Superior mesenteric vein occluded. Multiple venous collaterals. At least 2 metastases present within the lateral segment of left hepatic lobe. Additional right lobe metastases suspected. Ascites without peritoneal nodularity. 2. January 2015 hyperbilirubinemia due to obstruction of the distal common bile duct. Percutaneous biliary drain placed 08/05/2013. Percutaneous bile duct stent placed on 09/20/2013. 3. Port-A-Cath placement 03/12/2013. 4. Weight loss. She has lost 40 pounds over the past year. Recent weight gain is likely secondary to leg edema 5. Abdominal/back pain secondary to #1. She takes Dilaudid as needed. 6. History of hypertension. 7. Hypothyroid on replacement. 8. Diarrhea likely secondary to #1.    Disposition:  Brittney [name] [name] has metastatic pancreas cancer. I discussed the prognosis with Brittney [name] [name] and her sister. She understands the expected survival of "months "and agrees to a [facility] hospice referral. The diarrhea may be related to pancreas insufficiency. She will take the Creon consistently with meals and snacks. She will use Lomotil 4 times per day. If this does not help she will schedule an appointment with Dr. Hilarie Fredrickson. She will hold Boost for a few days to see if this helps.  Repeat ultrasound did not identify significant ascites for a palliative paracentesis. We will schedule a CT to further evaluate for ascites and obstruction.  Brittney [name] [name] will return for an office visit in approximately 3 weeks. She will use  support stockings for the leg edema. I explained the leg edema is likely secondary to hypoalbuminemia and increased abdominal pressure secondary to tumor. The exertional dyspnea is  likely related to restricted inspiration from the distended abdomen.  Brittney Coder, MD  02/10/2014  1:01 PM

## 2014-02-10 NOTE — Patient Instructions (Addendum)
[facility] Discharge Instructions  RECOMMENDATIONS MADE BY THE CONSULTANT AND ANY TEST RESULTS WILL BE SENT TO YOUR REFERRING PHYSICIAN.  MEDICATIONS PRESCRIBED:  Take your Creon #2 tabs with meals and #1 with each snack Try to take Lomotil #1 tablet at least  four times daily  INSTRUCTIONS GIVEN AND DISCUSSED: Discontinue Crestor, Boost and Multivitamin  SPECIAL INSTRUCTIONS/FOLLOW-UP: Will schedule scan to assess abdomen Elevate legs above level of your heart as much as possible to decrease swelling. Wear support hose during the day-put on in early am when edema is less  Thank you for choosing [facility] to provide your oncology and hematology care.  To afford each patient quality time with our providers, please arrive at least 30 minutes before your scheduled appointment time.  With your help, our goal is to use those 30 minutes to complete the necessary work-up to ensure our physicians have the information they need to help with your evaluation and healthcare recommendations.     ___________________  Should you have questions after your visit to [facility], please contact our office at [phone] between the hours of 8:30 a.m. and 4:30 p.m.  Voicemails left after 4:00 p.m. will not be returned until the following business day.  For prescription refill requests, have your pharmacy contact our office with your prescription refill request. We request 24 hour notice for all refill requests.

## 2014-02-11 ENCOUNTER — Telehealth: Payer: Self-pay | Admitting: Internal Medicine

## 2014-02-11 ENCOUNTER — Telehealth: Payer: Self-pay | Admitting: *Deleted

## 2014-02-11 NOTE — Telephone Encounter (Signed)
[facility] referral sent to [phone] per Dr. Benay Spice. He discussed code issue with patient at her visit, but daughter who was on phone listening did not want this decision made yet, even though patient expressed desire for DNR. Hospice will discuss this issue further with patient & family. Will call patient back next week to determine if her diarrhea is improved with the consistent use of Creon. If not, she needs to see Dr. Hilarie Fredrickson.

## 2014-02-11 NOTE — Telephone Encounter (Signed)
Instructed patient to continue her KCl daily due to the diarrhea. Have made Hospice referral and they should be calling soon to set up meeting. She informs nurse she has already called Dr. Vena Rua office to make an appointment.

## 2014-02-11 NOTE — Telephone Encounter (Signed)
Patient wants to be seen to discuss diarrhea.  Will come in and see Nicoletta Ba PA on 02/18/14 3:00

## 2014-02-14 ENCOUNTER — Ambulatory Visit (HOSPITAL_COMMUNITY): Admission: RE | Admit: 2014-02-14 | Payer: Medicare Other | Source: Ambulatory Visit

## 2014-02-15 ENCOUNTER — Ambulatory Visit (HOSPITAL_COMMUNITY)
Admission: RE | Admit: 2014-02-15 | Discharge: 2014-02-15 | Disposition: A | Discharge status: Home / Self Care | Source: Ambulatory Visit | Attending: Oncology | Admitting: Oncology

## 2014-02-15 DIAGNOSIS — K7689 Other specified diseases of liver: Secondary | ICD-10-CM | POA: Diagnosis not present

## 2014-02-15 DIAGNOSIS — R188 Other ascites: Secondary | ICD-10-CM | POA: Insufficient documentation

## 2014-02-15 DIAGNOSIS — M25473 Effusion, unspecified ankle: Secondary | ICD-10-CM | POA: Diagnosis not present

## 2014-02-15 DIAGNOSIS — M25476 Effusion, unspecified foot: Secondary | ICD-10-CM | POA: Insufficient documentation

## 2014-02-15 DIAGNOSIS — J90 Pleural effusion, not elsewhere classified: Secondary | ICD-10-CM | POA: Diagnosis not present

## 2014-02-15 DIAGNOSIS — R1900 Intra-abdominal and pelvic swelling, mass and lump, unspecified site: Secondary | ICD-10-CM | POA: Diagnosis not present

## 2014-02-15 DIAGNOSIS — C259 Malignant neoplasm of pancreas, unspecified: Secondary | ICD-10-CM | POA: Diagnosis not present

## 2014-02-15 DIAGNOSIS — J9819 Other pulmonary collapse: Secondary | ICD-10-CM | POA: Diagnosis not present

## 2014-02-16 ENCOUNTER — Ambulatory Visit (HOSPITAL_COMMUNITY): Payer: Medicare Other

## 2014-02-16 ENCOUNTER — Telehealth: Payer: Self-pay | Admitting: *Deleted

## 2014-02-16 DIAGNOSIS — C259 Malignant neoplasm of pancreas, unspecified: Secondary | ICD-10-CM

## 2014-02-16 NOTE — Telephone Encounter (Signed)
Received message from pt re: "want to know results of CT scan and my leg is still swollen; feels heavy"  Per Dr. Benay Spice; notified pt that CT scan showed more fluid accumulation, therapeutic paracentesis offered and pt stated she did want that set up.  Pt informed will call back with date/time of procedure.  Pt verbalized understanding.

## 2014-02-16 NOTE — Telephone Encounter (Signed)
Notified pt that paracentesis is scheduled 02/17/14 arrive at 9:45 am.  Pt verbalized understanding and expressed appreciation for call back.

## 2014-02-17 ENCOUNTER — Ambulatory Visit (HOSPITAL_COMMUNITY)
Admission: RE | Admit: 2014-02-17 | Discharge: 2014-02-17 | Disposition: A | Discharge status: Home / Self Care | Source: Ambulatory Visit | Attending: Oncology | Admitting: Oncology

## 2014-02-17 DIAGNOSIS — C259 Malignant neoplasm of pancreas, unspecified: Secondary | ICD-10-CM | POA: Diagnosis not present

## 2014-02-17 DIAGNOSIS — R109 Unspecified abdominal pain: Secondary | ICD-10-CM | POA: Insufficient documentation

## 2014-02-17 DIAGNOSIS — R188 Other ascites: Secondary | ICD-10-CM | POA: Diagnosis not present

## 2014-02-17 NOTE — Procedures (Signed)
US guided therapeutic paracentesis performed yielding 1.5 liters slightly turbid, yellow fluid. No immediate complications.

## 2014-02-18 ENCOUNTER — Ambulatory Visit (INDEPENDENT_AMBULATORY_CARE_PROVIDER_SITE_OTHER): Admitting: Physician Assistant

## 2014-02-18 ENCOUNTER — Other Ambulatory Visit: Payer: Medicare Other

## 2014-02-18 ENCOUNTER — Encounter: Payer: Self-pay | Admitting: Physician Assistant

## 2014-02-18 VITALS — BP 122/72 | HR 100 | Temp 98.8°F | Ht 65.0 in | Wt 134.4 lb

## 2014-02-18 DIAGNOSIS — R5381 Other malaise: Secondary | ICD-10-CM

## 2014-02-18 DIAGNOSIS — R509 Fever, unspecified: Secondary | ICD-10-CM

## 2014-02-18 DIAGNOSIS — R5383 Other fatigue: Secondary | ICD-10-CM

## 2014-02-18 DIAGNOSIS — C259 Malignant neoplasm of pancreas, unspecified: Secondary | ICD-10-CM

## 2014-02-18 DIAGNOSIS — C787 Secondary malignant neoplasm of liver and intrahepatic bile duct: Secondary | ICD-10-CM

## 2014-02-18 DIAGNOSIS — R531 Weakness: Secondary | ICD-10-CM

## 2014-02-18 DIAGNOSIS — R197 Diarrhea, unspecified: Secondary | ICD-10-CM

## 2014-02-18 DIAGNOSIS — K625 Hemorrhage of anus and rectum: Secondary | ICD-10-CM

## 2014-02-18 MED ORDER — MESALAMINE 400 MG PO CPDR: 400.0000 mg | DELAYED_RELEASE_CAPSULE | Freq: Three times a day (TID) | ORAL | Status: AC

## 2014-02-18 MED ORDER — CIPROFLOXACIN HCL 500 MG PO TABS: 500.0000 mg | ORAL_TABLET | Freq: Two times a day (BID) | ORAL | Status: AC

## 2014-02-18 NOTE — Progress Notes (Addendum)
Subjective:    Patient ID: Brittney [name], female    DOB: 07/14/1932, 80 y.o.   MRN: [id number]  HPI  Brittney [name] is a very nice 80 year old female known recently to Dr. Hilarie Fredrickson who has a diagnosis of metastatic pancreatic cancer with liver and SMV and involvement. She was last seen in our office in May. She is being followed by Dr. Benay Spice, and is not on any active treatment. She has had a recent referral to hospice. Patient had biliary stenting done for obstruction in February 2015. Patient also has a prior to her diagnosis of segmental colitis associated with diverticular disease and had been on mesalamine therapy. She has had chronic problems with diarrhea which may be multifactorial at this point. She is referred back today for evaluation of persistent diarrhea. She states that she's having 4-5 bowel movements per day and that her stools are always loose. She generally had not been seen any blood but has seen a small amount of bright red blood on the tissue over the past couple of days. She has been taking Creon 2 before each meal, she uses Lomotil as needed ,has not been taking her Delzicol . Today she says she feels poorly, she woke up last night with back pain and also had a fever in the middle of the night which she says may have been as high as 104. She had some chills. She has not had any further fever or chills today but says she feels weak. She had undergone a therapeutic paracentesis yesterday no cell counts were done but she had 1.5 L of fluid removed  and was noted to have yellow turbid fluid. She has no current complaints of cough or sputum production no dysuria and no abdominal pain other than her chronic pain from her pancreatic cancer.    Review of Systems  Constitutional: Positive for fever, appetite change, fatigue and unexpected weight change.  HENT: Negative.   Eyes: Negative.   Respiratory: Negative.   Cardiovascular: Positive for leg swelling.  Gastrointestinal: Positive for  abdominal pain, diarrhea, blood in stool, abdominal distention and rectal pain.  Endocrine: Negative.   Genitourinary: Negative.   Musculoskeletal: Positive for back pain.  Skin: Negative.   Allergic/Immunologic: Negative.   Neurological: Positive for weakness.  Hematological: Negative.   Psychiatric/Behavioral: Negative.    Outpatient Prescriptions Prior to Visit  Medication Sig Dispense Refill  . amLODipine-valsartan (EXFORGE) 10-160 MG per tablet Take 1 tablet by mouth daily.      Marland Kitchen aspirin EC 81 MG tablet Take 81 mg by mouth daily.      . cholecalciferol (VITAMIN D) 1000 UNITS tablet Take 1,000 Units by mouth daily.       . Cyanocobalamin (VITAMIN B-12 CR) 1000 MCG TBCR Take 1 tablet by mouth.      . diphenoxylate-atropine (LOMOTIL) 2.5-0.025 MG per tablet Take 1 tablet by mouth 4 (four) times daily as needed for diarrhea or loose stools.  60 tablet  3  . esomeprazole (NEXIUM) 40 MG capsule Take 1 capsule (40 mg total) by mouth daily before breakfast.  25 capsule  0  . HYDROmorphone (DILAUDID) 2 MG tablet Take 1-2 tablets (2-4 mg total) by mouth every 4 (four) hours as needed for severe pain.  100 tablet  0  . levothyroxine (SYNTHROID, LEVOTHROID) 125 MCG tablet Take 125 mcg by mouth daily.        . megestrol (MEGACE) 40 MG/ML suspension TAKE 5 MLS (200 MG TOTAL) BY MOUTH 2 (TWO) TIMES  DAILY.  300 mL  1  . Pancrelipase, Lip-Prot-Amyl, (CREON) 36000 UNITS CPEP Take 2 capsules by mouth 3 (three) times daily with meals.  100 capsule  0  . Potassium Chloride ER 20 MEQ TBCR Take 1 tablet by mouth daily.  10 tablet  0  . Mesalamine (DELZICOL) 400 MG CPDR DR capsule Take 3 capsules (1,200 mg total) by mouth 3 (three) times daily as needed. For colitis flareup.  84 capsule  0  . lidocaine-prilocaine (EMLA) cream Apply to port site 1 hour prior to access.      . Nutritional Supplements (BOOST BREEZE) LIQD Take 1 Can by mouth 2 (two) times daily.  60 Can  0   No facility-administered medications  prior to visit.   No Known Allergies Patient Active Problem List   Diagnosis Date Noted  . Fatigue 02/03/2014  . Abdominal pain, unspecified site 02/03/2014  . Dehydration 02/03/2014  . Hypokalemia 02/02/2014  . Malignant neoplasm of pancreas, part unspecified 11/23/2013  . Pancreatic contusion 11/03/2012  . Sternal fracture 11/03/2012  . Crohn disease 03/19/2011  . Segmental colitis 03/19/2011  . CROHN'S DISEASE-LARGE INTESTINE 01/25/2010  . OTH&UNSPEC NONINFECTIOUS GASTROENTERITIS&COLITIS 02/27/2009  . UNSPECIFIED URINARY INCONTINENCE 02/27/2009  . HYPOTHYROIDISM 04/29/2008  . HYPERLIPIDEMIA 04/29/2008  . GERD 04/29/2008  . BARRETTS ESOPHAGUS 04/29/2008  . HIATAL HERNIA 04/29/2008  . DIVERTICULOSIS, COLON 04/29/2008  . CONSTIPATION, CHRONIC 04/29/2008  . ARTHRITIS, RHEUMATOID 04/29/2008   History  Substance Use Topics  . Smoking status: Former Smoker -- 0.12 packs/day for 20 years    Types: Cigarettes    Quit date: 10/01/2011  . Smokeless tobacco: Never Used  . Alcohol Use: No   family history includes Colon cancer in her mother; Esophageal cancer in her brother; Heart disease in her father and mother.     Objective:   Physical Exam well-developed elderly African American female somewhat frail-appearing well dressed very pleasant. Blood pressure 122/72 pulse 100 temp 98 8 height 5 foot 5 weight 134. HEENT ;nontraumatic normocephalic EOMI PERRLA sclera anicteric, Supple ;no JVD, Cardiovascular; regular rate and rhythm with S1-S2 no murmur or gallop, Pulmonary ;clear bilaterally, Abdomen ;protuberant positive ascites nontender no focal tenderness no guarding or rebound no palpable mass or hepatosplenomegaly, Rectal; exam tender on digital exam no external lesion or internal lesion felt stool is brown and Hemoccult positive, Extremities; 2+ edema to the knees bilaterally, Psych; mood and affect appropriate        Assessment & Plan:  #103  80 year old female with metastatic  pancreatic cancer with SMV and hepatic involvement slowly progressing-has just been referred to hospice, remains quite functional. #2 malignant ascites-status post therapeutic paracentesis apparently done yesterday ordered per oncology no cell counts done #3 diarrhea-likely multifactorial with underlying pancreatic insufficiency and history of segmental colitis associated with diverticular disease #4 hematochezia-likely secondary to active segmental colitis #5 fever-1 episode, afebrile here today-etiology not clear-question bacteremia associated with paracentesis, patient also has biliary stent in place, rule out UTI  Plan; requested that patient have labs today however she has a port and does not what labs drawn peripherally as she says it is generally not successful. Will check UA and urine culture Start empiric Cipro 500 mg by mouth twice daily x5 days Patient is instructed that is she  has any further episodes of fever over the weekend that she should go to the emergency room for further evaluation with labs , blood cultures ,and imaging Restart Delsym call 400 mg 3 tablets by mouth 3 times  daily Continue Creon 36 to by mouth a.c. Continue Nexium 40 mg by mouth every morning Start Preparation H suppositories each bedtime x7-10 days then as needed  Addendum: On review of chart, patient taken to ED the next day and subsequently died of metastatic pancreatic cancer and presumed septic shock.

## 2014-02-18 NOTE — Patient Instructions (Addendum)
Please go to the basement level to our lab and get a urine test . We sent a prescription  For Delzicol, 400 mg, take 3 tab 3 times daily. We also sent a prescription for Cipro 500 mg. Take 2 tab daily for 10 days. Samples provided.  Continue Creon 2 with each meal. Get Preperation H suppositories at the pharmacy and use 1 at bedtime for 10 night.

## 2014-02-19 ENCOUNTER — Emergency Department (HOSPITAL_COMMUNITY)

## 2014-02-19 ENCOUNTER — Emergency Department (HOSPITAL_COMMUNITY)
Admission: EM | Admit: 2014-02-19 | Discharge: 2014-02-26 | Disposition: E | Attending: Emergency Medicine | Admitting: Emergency Medicine

## 2014-02-19 DIAGNOSIS — Z860100 Personal history of colon polyps, unspecified: Secondary | ICD-10-CM | POA: Insufficient documentation

## 2014-02-19 DIAGNOSIS — A419 Sepsis, unspecified organism: Secondary | ICD-10-CM | POA: Insufficient documentation

## 2014-02-19 DIAGNOSIS — K5090 Crohn's disease, unspecified, without complications: Secondary | ICD-10-CM | POA: Insufficient documentation

## 2014-02-19 DIAGNOSIS — Z792 Long term (current) use of antibiotics: Secondary | ICD-10-CM | POA: Diagnosis not present

## 2014-02-19 DIAGNOSIS — I10 Essential (primary) hypertension: Secondary | ICD-10-CM | POA: Diagnosis not present

## 2014-02-19 DIAGNOSIS — Z87828 Personal history of other (healed) physical injury and trauma: Secondary | ICD-10-CM | POA: Diagnosis not present

## 2014-02-19 DIAGNOSIS — M069 Rheumatoid arthritis, unspecified: Secondary | ICD-10-CM | POA: Diagnosis not present

## 2014-02-19 DIAGNOSIS — E039 Hypothyroidism, unspecified: Secondary | ICD-10-CM | POA: Diagnosis not present

## 2014-02-19 DIAGNOSIS — Z8509 Personal history of malignant neoplasm of other digestive organs: Secondary | ICD-10-CM | POA: Diagnosis not present

## 2014-02-19 DIAGNOSIS — R6521 Severe sepsis with septic shock: Secondary | ICD-10-CM

## 2014-02-19 DIAGNOSIS — Z87891 Personal history of nicotine dependence: Secondary | ICD-10-CM | POA: Insufficient documentation

## 2014-02-19 DIAGNOSIS — Z8701 Personal history of pneumonia (recurrent): Secondary | ICD-10-CM | POA: Diagnosis not present

## 2014-02-19 DIAGNOSIS — Z7982 Long term (current) use of aspirin: Secondary | ICD-10-CM | POA: Insufficient documentation

## 2014-02-19 DIAGNOSIS — E872 Acidosis, unspecified: Secondary | ICD-10-CM

## 2014-02-19 DIAGNOSIS — K219 Gastro-esophageal reflux disease without esophagitis: Secondary | ICD-10-CM | POA: Insufficient documentation

## 2014-02-19 DIAGNOSIS — Z79899 Other long term (current) drug therapy: Secondary | ICD-10-CM | POA: Insufficient documentation

## 2014-02-19 DIAGNOSIS — R4182 Altered mental status, unspecified: Secondary | ICD-10-CM | POA: Diagnosis present

## 2014-02-19 DIAGNOSIS — I9589 Other hypotension: Secondary | ICD-10-CM

## 2014-02-19 DIAGNOSIS — R6520 Severe sepsis without septic shock: Secondary | ICD-10-CM

## 2014-02-19 DIAGNOSIS — R0609 Other forms of dyspnea: Secondary | ICD-10-CM | POA: Diagnosis not present

## 2014-02-19 DIAGNOSIS — R0989 Other specified symptoms and signs involving the circulatory and respiratory systems: Secondary | ICD-10-CM | POA: Insufficient documentation

## 2014-02-19 LAB — CBC WITH DIFFERENTIAL/PLATELET
BASOS PCT: 0 % (ref 0–1)
Basophils Absolute: 0 10*3/uL (ref 0.0–0.1)
EOS PCT: 1 % (ref 0–5)
Eosinophils Absolute: 0.2 10*3/uL (ref 0.0–0.7)
HCT: 14.3 % — ABNORMAL LOW (ref 36.0–46.0)
HEMOGLOBIN: 4.7 g/dL — AB (ref 12.0–15.0)
Lymphocytes Relative: 14 % (ref 12–46)
Lymphs Abs: 3.1 10*3/uL (ref 0.7–4.0)
MCH: 29.2 pg (ref 26.0–34.0)
MCHC: 32.9 g/dL (ref 30.0–36.0)
MCV: 88.8 fL (ref 78.0–100.0)
MONOS PCT: 6 % (ref 3–12)
Monocytes Absolute: 1.3 10*3/uL — ABNORMAL HIGH (ref 0.1–1.0)
Neutro Abs: 17.6 10*3/uL — ABNORMAL HIGH (ref 1.7–7.7)
Neutrophils Relative %: 79 % — ABNORMAL HIGH (ref 43–77)
PLATELETS: 115 10*3/uL — AB (ref 150–400)
RBC: 1.61 MIL/uL — AB (ref 3.87–5.11)
RDW: 16 % — ABNORMAL HIGH (ref 11.5–15.5)
WBC: 22.2 10*3/uL — AB (ref 4.0–10.5)

## 2014-02-19 LAB — BLOOD GAS, ARTERIAL
Acid-Base Excess: 20.3 mmol/L (ref 0.0–2.0)
BICARBONATE: 8.3 meq/L — AB (ref 20.0–24.0)
Drawn by: 308601
FIO2: 1 %
O2 Saturation: 51.2 %
PEEP: 5 cmH2O
PH ART: 7.027 — AB (ref 7.350–7.450)
Patient temperature: 98.6
RATE: 14 resp/min
TCO2: 9.3 mmol/L (ref 0–100)
VT: 450 mL
pCO2 arterial: 33.1 mmHg — ABNORMAL LOW (ref 35.0–45.0)
pO2, Arterial: 42.1 mmHg — ABNORMAL LOW (ref 80.0–100.0)

## 2014-02-19 LAB — COMPREHENSIVE METABOLIC PANEL
ALBUMIN: 1.8 g/dL — AB (ref 3.5–5.2)
ALK PHOS: 175 U/L — AB (ref 39–117)
ALT: 116 U/L — AB (ref 0–35)
AST: 236 U/L — ABNORMAL HIGH (ref 0–37)
Anion gap: 27 — ABNORMAL HIGH (ref 5–15)
BILIRUBIN TOTAL: 1.1 mg/dL (ref 0.3–1.2)
BUN: 9 mg/dL (ref 6–23)
CHLORIDE: 88 meq/L — AB (ref 96–112)
CO2: 12 meq/L — AB (ref 19–32)
Calcium: 8.4 mg/dL (ref 8.4–10.5)
Creatinine, Ser: 1.03 mg/dL (ref 0.50–1.10)
GFR calc Af Amer: 58 mL/min — ABNORMAL LOW (ref >90)
GFR calc non Af Amer: 50 mL/min — ABNORMAL LOW (ref >90)
Glucose, Bld: 163 mg/dL — ABNORMAL HIGH (ref 70–99)
POTASSIUM: 5.4 meq/L — AB (ref 3.7–5.3)
Sodium: 127 mEq/L — ABNORMAL LOW (ref 137–147)
Total Protein: 4.9 g/dL — ABNORMAL LOW (ref 6.0–8.3)

## 2014-02-19 LAB — I-STAT CG4 LACTIC ACID, ED: LACTIC ACID, VENOUS: 16.08 mmol/L — AB (ref 0.5–2.2)

## 2014-02-19 LAB — TROPONIN I: Troponin I: <0.3 ng/mL (ref <0.30)

## 2014-02-19 LAB — POC OCCULT BLOOD, ED: FECAL OCCULT BLD: POSITIVE — AB

## 2014-02-19 MED ORDER — SODIUM CHLORIDE 0.9 % IV BOLUS (SEPSIS)
30.0000 mL/kg | Freq: Once | INTRAVENOUS | Status: AC
Start: 2014-02-19 — End: 2014-02-19
  Administered 2014-02-19: 2000 mL via INTRAVENOUS

## 2014-02-19 MED ORDER — VANCOMYCIN HCL IN DEXTROSE 1-5 GM/200ML-% IV SOLN
1000.0000 mg | Freq: Once | INTRAVENOUS | Status: AC
  Administered 2014-02-19: 1000 mg via INTRAVENOUS
  Filled 2014-02-19: qty 200

## 2014-02-19 MED ORDER — SODIUM BICARBONATE 8.4 % IV SOLN
50.0000 meq | Freq: Once | INTRAVENOUS | Status: DC
Start: 2014-02-19 — End: 2014-02-19

## 2014-02-19 MED ORDER — SODIUM CHLORIDE 0.9 % IV SOLN
1000.0000 mL | INTRAVENOUS | Status: DC
  Administered 2014-02-19 (×2): 1000 mL via INTRAVENOUS

## 2014-02-19 MED ORDER — EPINEPHRINE HCL 1 MG/ML IJ SOLN
INTRAMUSCULAR | Status: AC
Start: 2014-02-19 — End: 2014-02-19
  Administered 2014-02-19
  Filled 2014-02-19: qty 1

## 2014-02-19 MED ORDER — NALOXONE HCL 1 MG/ML IJ SOLN
INTRAMUSCULAR | Status: AC | PRN
  Administered 2014-02-19: 2 mg via INTRAMUSCULAR
  Administered 2014-02-19: 01:00:00

## 2014-02-19 MED ORDER — DEXAMETHASONE SODIUM PHOSPHATE 10 MG/ML IJ SOLN
10.0000 mg | Freq: Once | INTRAMUSCULAR | Status: AC
  Administered 2014-02-19: 10 mg via INTRAVENOUS
  Filled 2014-02-19: qty 1

## 2014-02-19 MED ORDER — NALOXONE HCL 1 MG/ML IJ SOLN
INTRAMUSCULAR | Status: AC
  Filled 2014-02-19: qty 2

## 2014-02-19 MED ORDER — NALOXONE HCL 1 MG/ML IJ SOLN
INTRAMUSCULAR | Status: DC | PRN
  Administered 2014-02-19: .6 mg via INTRAMUSCULAR

## 2014-02-19 MED ORDER — SODIUM CHLORIDE 0.9 % IV BOLUS (SEPSIS)
1000.0000 mL | Freq: Once | INTRAVENOUS | Status: AC
Start: 2014-02-19 — End: 2014-02-19
  Administered 2014-02-19: 1000 mL via INTRAVENOUS

## 2014-02-19 MED ORDER — DEXTROSE 5 % IV SOLN
4.0000 ug/min | INTRAVENOUS | Status: DC
  Filled 2014-02-19: qty 4

## 2014-02-19 MED ORDER — PIPERACILLIN-TAZOBACTAM 3.375 G IVPB 30 MIN
3.3750 g | Freq: Once | INTRAVENOUS | Status: AC
  Administered 2014-02-19: 3.375 g via INTRAVENOUS
  Filled 2014-02-19: qty 50

## 2014-02-19 MED ORDER — SODIUM CHLORIDE 0.9 % IV SOLN
INTRAVENOUS | Status: AC | PRN
  Administered 2014-02-19: 10 mL/h via INTRAVENOUS

## 2014-02-19 MED ORDER — EPINEPHRINE HCL 0.1 MG/ML IJ SOSY
PREFILLED_SYRINGE | INTRAMUSCULAR | Status: DC | PRN
  Administered 2014-02-19: 1 via INTRAVENOUS
  Administered 2014-02-19: 0.1 mg via INTRAVENOUS

## 2014-02-19 MED FILL — Medication: Qty: 1 | Status: AC

## 2014-02-21 LAB — I-STAT CHEM 8, ED
BUN: 10 mg/dL (ref 6–23)
CREATININE: 1.1 mg/dL (ref 0.50–1.10)
Calcium, Ion: 1.08 mmol/L — ABNORMAL LOW (ref 1.13–1.30)
Chloride: 96 mEq/L (ref 96–112)
GLUCOSE: 158 mg/dL — AB (ref 70–99)
HCT: 14 % — ABNORMAL LOW (ref 36.0–46.0)
Hemoglobin: 4.8 g/dL — CL (ref 12.0–15.0)
Potassium: 5.9 mEq/L — ABNORMAL HIGH (ref 3.7–5.3)
SODIUM: 125 meq/L — AB (ref 137–147)
TCO2: 13 mmol/L (ref 0–100)

## 2014-02-25 LAB — CULTURE, BLOOD (ROUTINE X 2)
CULTURE: NO GROWTH
Culture: NO GROWTH
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
## 2014-02-26 NOTE — ED Notes (Signed)
Bed: WA14 Expected date:  Expected time:  Means of arrival:  Comments: Hold  

## 2014-02-26 NOTE — ED Notes (Signed)
1 amp epi

## 2014-02-26 NOTE — ED Notes (Signed)
Bed: WA16 Expected date:  Expected time:  Means of arrival:  Comments: Hold 

## 2014-02-26 NOTE — Code Documentation (Signed)
Code stopped

## 2014-02-26 NOTE — ED Notes (Signed)
Zavitz, EDP made aware of patient CG4 Lactic results.

## 2014-02-26 NOTE — ED Notes (Signed)
Notified Main Lab per Dr. Reather Converse canceled blood work on patient.

## 2014-02-26 NOTE — ED Notes (Signed)
Spoke w/ Dr. Lona Kettle from Oncology to notify him of Brittney [name]'s death and to ensure that Dr. Benay Spice her Oncologist would be willing to sign the death certificate.  Dr. Lona Kettle was agreeable to this and informed this writer he would pass on the request to Dr. Benay Spice.

## 2014-02-26 NOTE — ED Notes (Signed)
HGB 4.7, Dr. Reather Converse and Sharrie Rothman RN notified

## 2014-02-26 NOTE — ED Notes (Signed)
CDS  Called by Edward Qualia  RN

## 2014-02-26 NOTE — ED Notes (Signed)
Bed: RESB Expected date:  Expected time:  Means of arrival:  Comments: EMS 80yo Pancreatitic CA, hypotensive

## 2014-02-26 NOTE — ED Notes (Signed)
Pt began declining,  Loosing control of bowels and fluid coming from mouth around oj,  Pt was suctioned,  Heart rate decreased,  Dr Nuala Alpha called to bedside immediately,  No pulse.  cpr was began and pt's family brought to bedside,  Pt's family requested CPR be stopped.   Wishes concured

## 2014-02-26 NOTE — ED Notes (Signed)
No urine output as this time

## 2014-02-26 NOTE — Code Documentation (Signed)
No pulse,  heartrate decreased,  cpr started

## 2014-02-26 NOTE — Code Documentation (Signed)
oj placed by Jonelle Sidle, RN charge,

## 2014-02-26 DEATH — deceased

## 2014-03-02 ENCOUNTER — Other Ambulatory Visit: Payer: Medicare Other

## 2014-03-02 ENCOUNTER — Ambulatory Visit: Payer: Medicare Other | Admitting: Nurse Practitioner
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
## 2015-11-15 IMAGING — CT CT ABD-PEL WO/W CM
2 of 10 series · 11 of 46 positions shown, 17 images · IV contrast (omnipaque)
Comparison: Limited abdominal ultrasound 11/26/2013. Abdominal
pelvic MRI 11/04/2012.

CLINICAL DATA: Pancreatic cancer. Left upper quadrant abdominal and
back pain.

EXAM:
CT ABDOMEN AND PELVIS WITHOUT AND WITH CONTRAST
TECHNIQUE: Multidetector CT imaging of the abdomen and pelvis was performed
following the standard protocol before and following the bolus
administration of intravenous contrast.
CONTRAST:  100mL OMNIPAQUE IOHEXOL 300 MG/ML SOLN

[Series 7: venous thins pacs · axial · portal-venous · 0.62mm/px · z∈[-406,-92]mm · 8 of 136 slices shown, 13 images]
[im 16/136  soft-tissue]
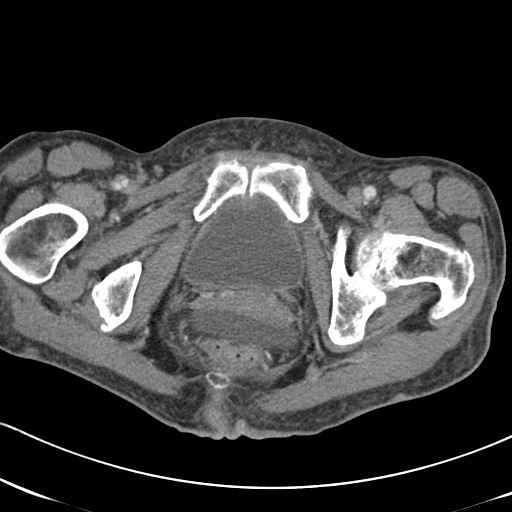
[im 16/136  bone]
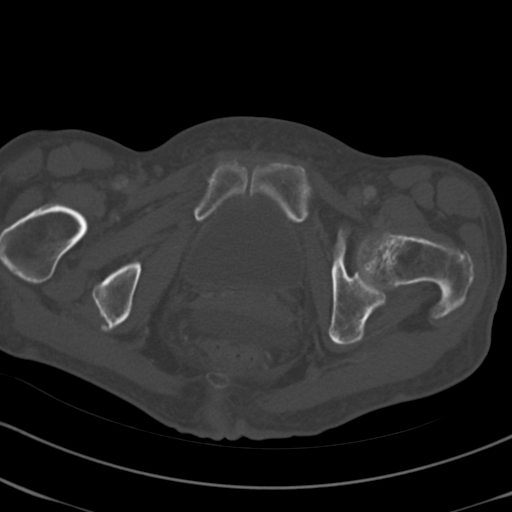
[im 31/136  soft-tissue]
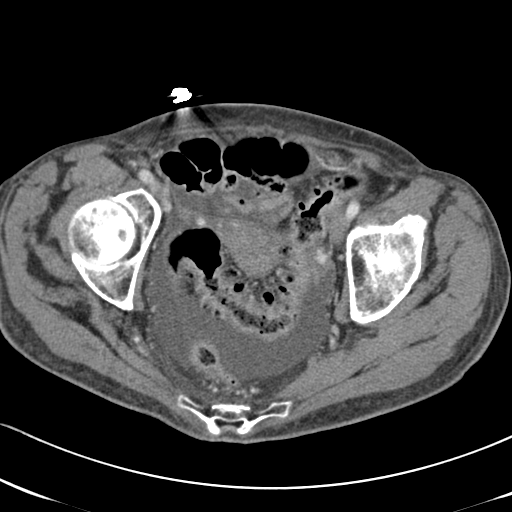
[im 46/136  soft-tissue]
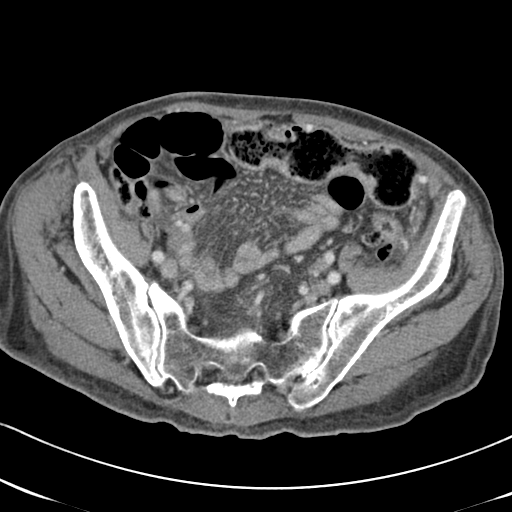
[im 61/136  soft-tissue]
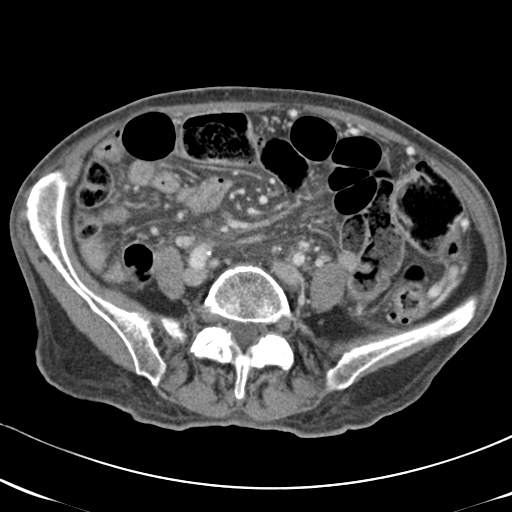
[im 76/136  soft-tissue]
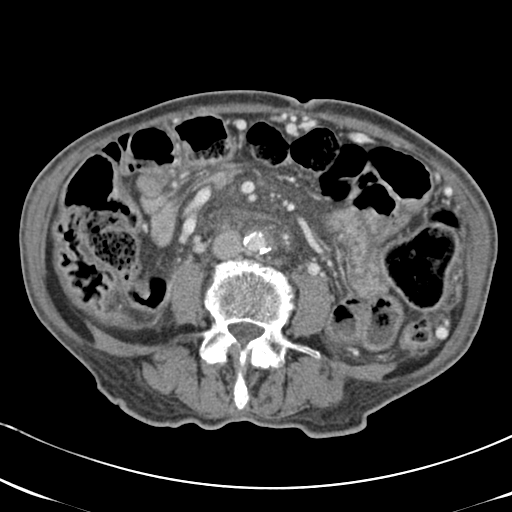
[im 76/136  lung]
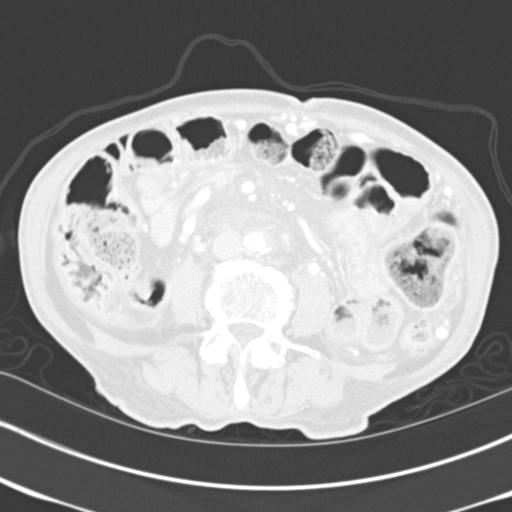
[im 91/136  soft-tissue]
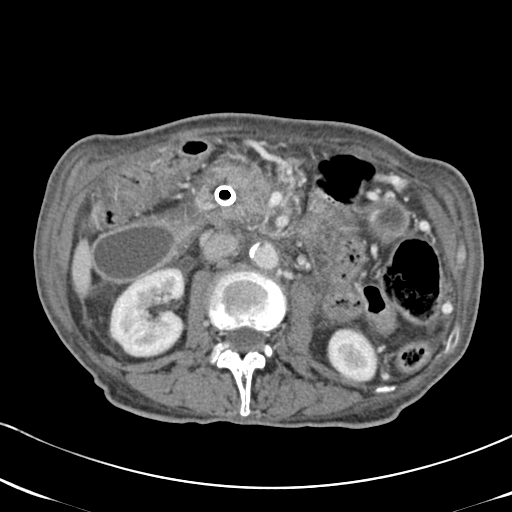
[im 91/136  lung]
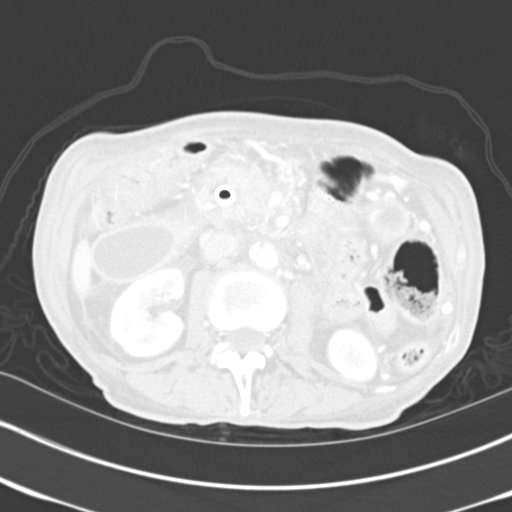
[im 106/136  soft-tissue]
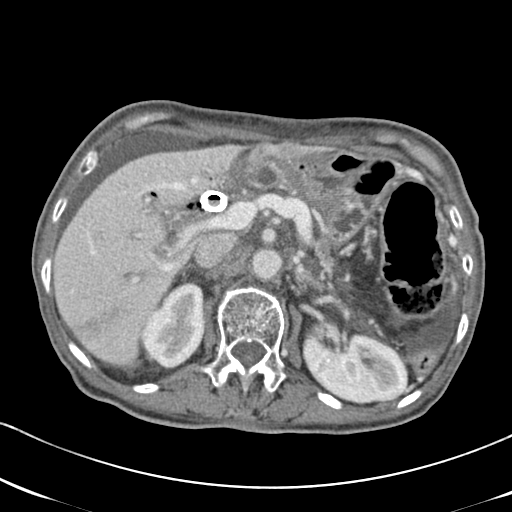
[im 106/136  lung]
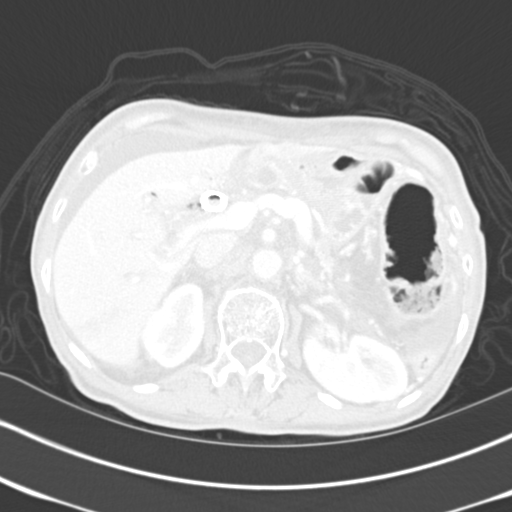
[im 121/136  soft-tissue]
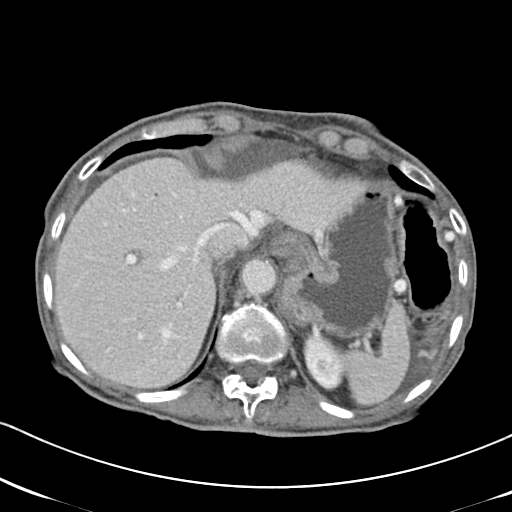
[im 121/136  lung]
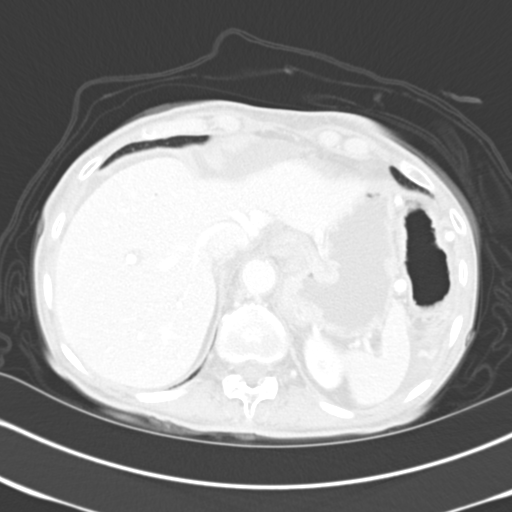

[Series 602: <mpr thick range> · coronal · 0.62mm/px · 3 of 73 slices shown, 4 images]
[im 19/73  soft-tissue]
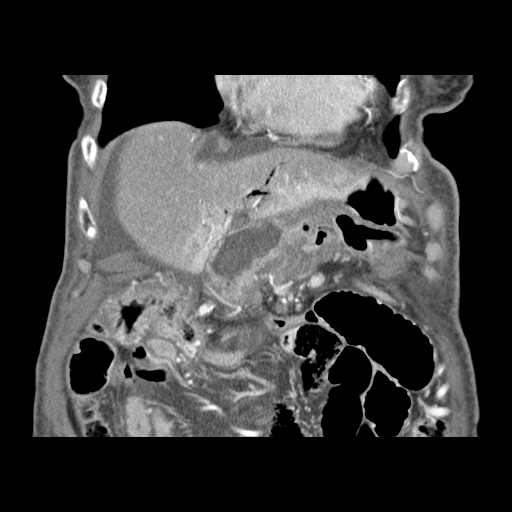
[im 37/73  soft-tissue]
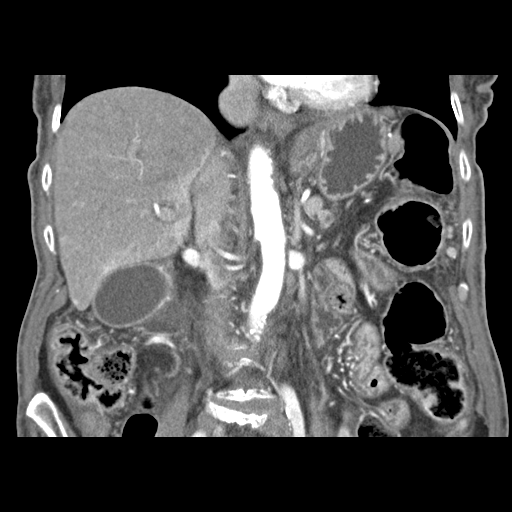
[im 37/73  bone]
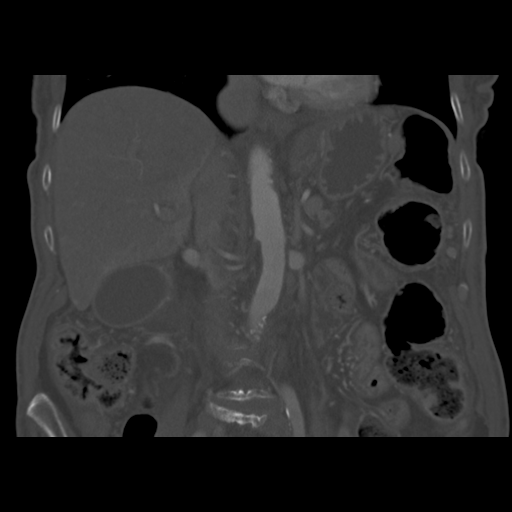
[im 55/73  soft-tissue]
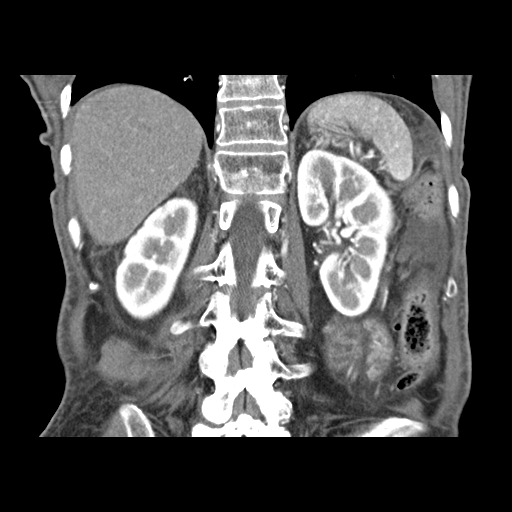

[11 of 46 positions shown; findings below may reference images not displayed]

FINDINGS: The lung bases are clear. There is no significant pleural or
pericardial effusion.

Metallic biliary stent is in place. The stent is patent without
obvious tumor ingrowth. There is a small amount of fluid within the
stent lumen. There is pneumobilia within the nondependent portions
of the biliary system. No significant biliary dilatation is evident.
Previously demonstrated mass involving the pancreatic head and body
appears smaller, measuring approximately 11 mm transverse on image
40. However, the pancreas and surrounding fat planes are obscured by
edema, probably related to radiation therapy. Atrophy and pancreatic
ductal dilatation in the body and tail are noted.

The portal and splenic veins remain patent. The superior mesenteric
vein is now completely excluded. There are multiple venous
collateral vessels anteriorly and within the left abdomen. There is
periportal edema within the liver. Arterial phase images demonstrate
several wedge-shaped areas of hypoperfusion peripherally in the
right hepatic lobe and throughout the lateral segment of the left
hepatic lobe. Portal phase images demonstrate 2 ring-enhancing
masses within the lateral segment of the left left hepatic lobe,
measuring 1.8 and 1.3 cm on image 29 of series 7. These are
worrisome for metastatic disease. There are possible additional
metastases within the right hepatic lobe, including 10 11 mm lesion
on image 34.

There is mesenteric edema with a moderate amount of ascites,
primarily in the pelvis. No definite peritoneal nodularity or
discrete peripancreatic adenopathy identified. The gallbladder
demonstrates mild wall thickening. The spleen, adrenal glands and
kidneys appear normal.

There is no evidence of bowel obstruction. The uterus, ovaries and
bladder appear normal.

There are stable degenerative changes throughout the spine. No
worrisome osseous findings are seen.
IMPRESSION: 1.  Biliary stent patency with resolution of biliary dilatation.
2. The mass involving the pancreatic head is less well-defined and
appears slightly smaller. However, the superior mesenteric vein is
occluded and there are multiple venous collaterals.
3. At least 2 metastases are present within the lateral segment of
the left hepatic lobe. Additional right lobe metastases are
suspected, less definitive due to perfusion anomalies in the right
hepatic lobe.
4. Ascites without peritoneal nodularity.
ADDENDUM:

Subsequent comparison has been requested within abdominal pelvic CT
include arterial phase imaging through the liver. The biliary stent
and residual pancreatic mass have not significantly changed.
However, the liver lesions are new and/or progressive, especially
within the left hepatic lobe. Some of the changes within the right
lobe may be treatment related or perfusion defects. However, the
left lobe findings remain concerning for progressive metastatic
disease.

## 2015-12-26 IMAGING — US US ABDOMEN LIMITED
1 series · 4 of 4 positions shown · non-contrast
Comparison: None.

CLINICAL DATA: Pancreatic cancer, dyspnea, abdominal distention.
Request is made for therapeutic paracentesis.

EXAM:
US ABDOMEN LIMITED

[Series 1: us abdomen limited · 0.27mm/px · 4 of 4 slices shown]
[im 1/4]
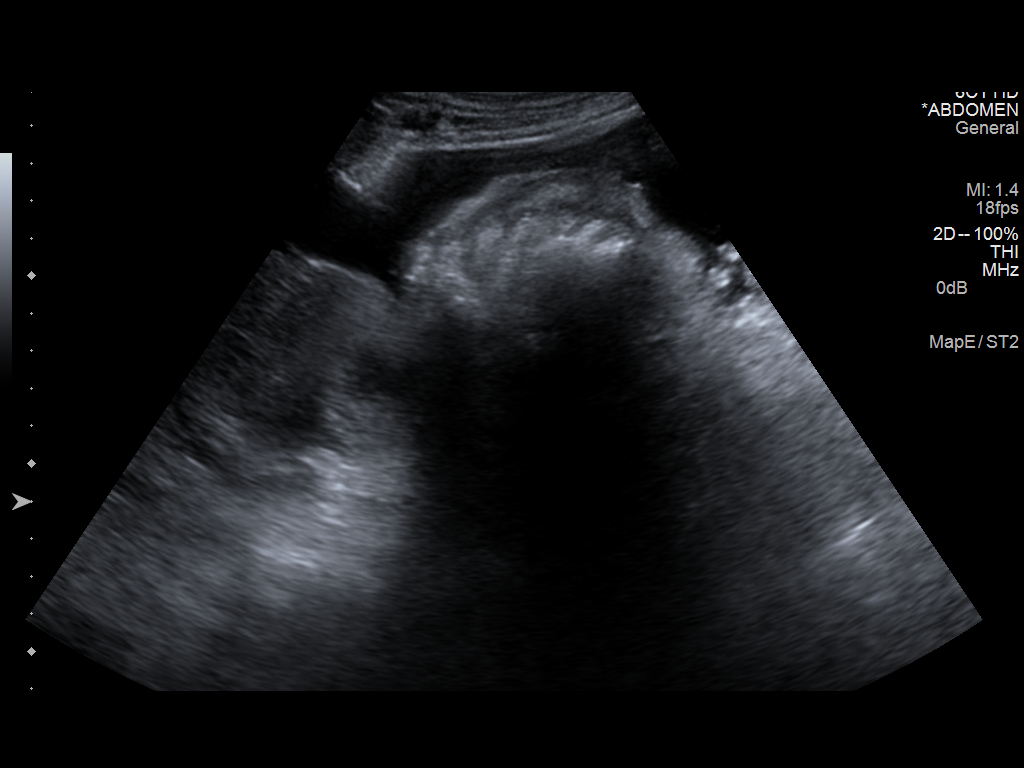
[im 2/4]
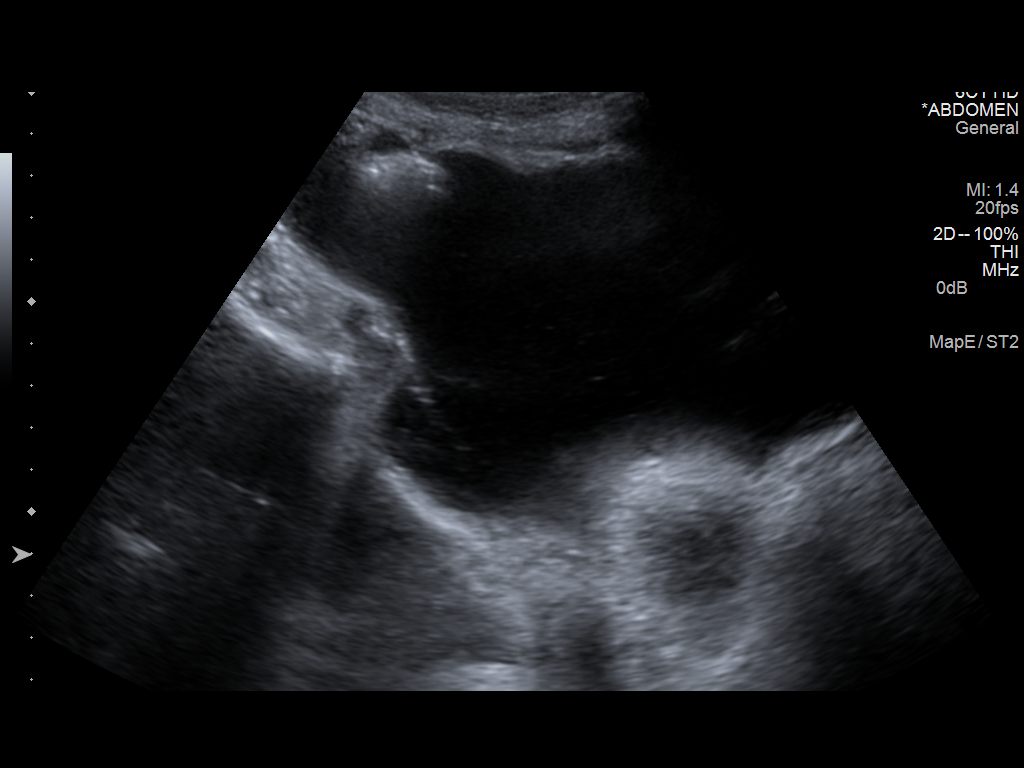
[im 3/4]
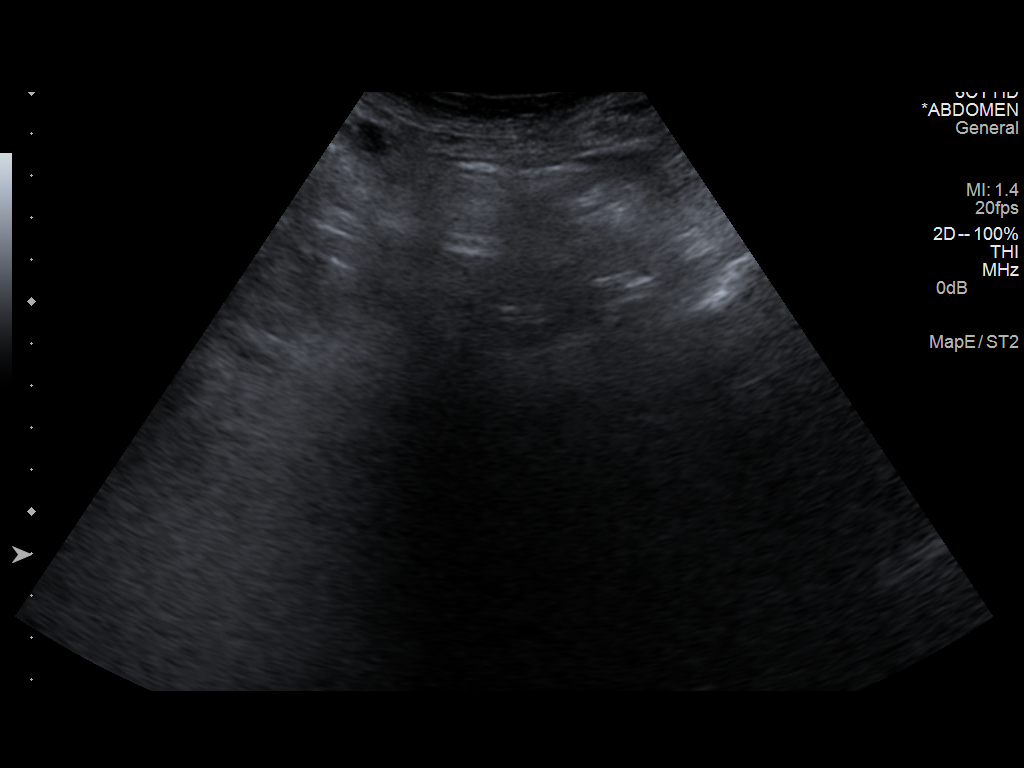
[im 4/4]
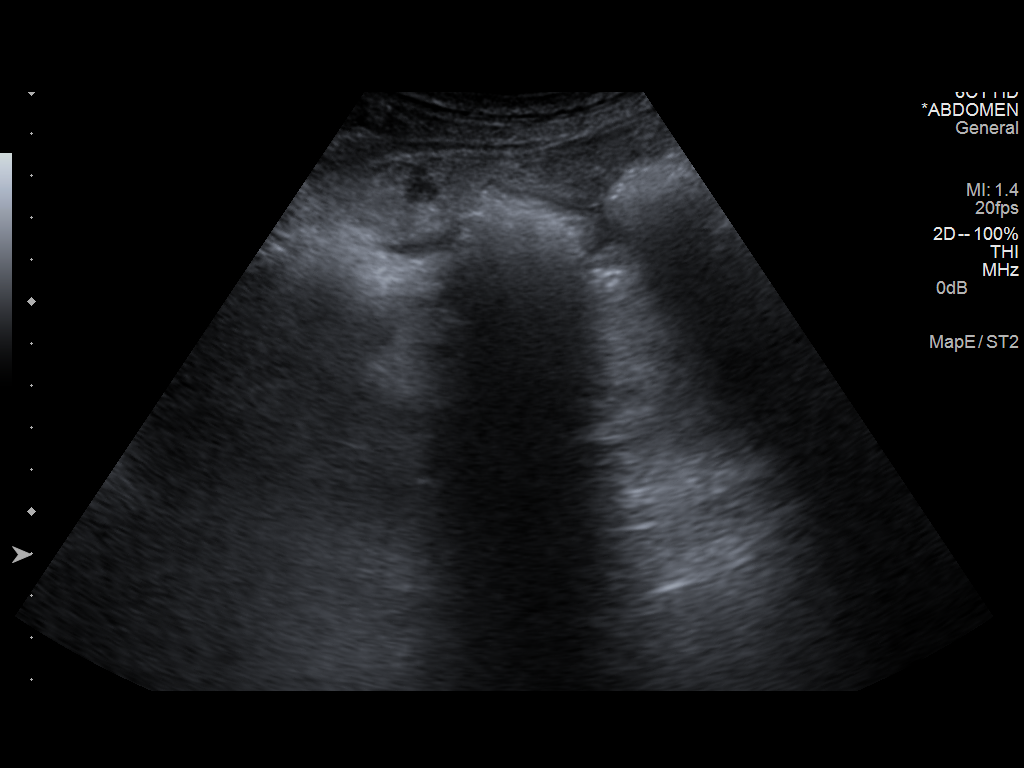

[4 of 4 positions shown; findings below may reference images not displayed]

FINDINGS: Limited ultrasound of abdomen in all 4 quadrants today reveals only
a small amount of ascites located primarily superficial to the
bladder and within the right lateral abdominal region. Following a
discussion with the patient she wishes to postpone paracentesis
until more fluid accumulates or she becomes more symptomatic.
IMPRESSION: Limited ultrasound of abdomen revealing small amount of ascites.
Therapeutic paracentesis not performed at this time secondary to
patient wishes. She will contact Oncology staff if she becomes more
distended or symptomatic and we will reassess for accumulation of
ascites / need for paracentesis at that time.
